# Patient Record
Sex: Female | Born: 1995 | Race: White | Hispanic: No | Marital: Single | Smoking: Never smoker
Health system: Southern US, Community
[De-identification: ages and names within clinical notes are randomized; demographics above are authoritative.]

## PROBLEM LIST (undated history)

## (undated) DIAGNOSIS — R51 Headache: Secondary | ICD-10-CM

## (undated) DIAGNOSIS — F419 Anxiety disorder, unspecified: Secondary | ICD-10-CM

## (undated) DIAGNOSIS — L309 Dermatitis, unspecified: Secondary | ICD-10-CM

## (undated) DIAGNOSIS — F329 Major depressive disorder, single episode, unspecified: Secondary | ICD-10-CM

## (undated) DIAGNOSIS — E669 Obesity, unspecified: Secondary | ICD-10-CM

## (undated) DIAGNOSIS — F32A Depression, unspecified: Secondary | ICD-10-CM

## (undated) DIAGNOSIS — J45909 Unspecified asthma, uncomplicated: Secondary | ICD-10-CM

## (undated) DIAGNOSIS — R519 Headache, unspecified: Secondary | ICD-10-CM

## (undated) HISTORY — DX: Depression, unspecified: F32.A

## (undated) HISTORY — DX: Headache, unspecified: R51.9

## (undated) HISTORY — DX: Major depressive disorder, single episode, unspecified: F32.9

## (undated) HISTORY — PX: WISDOM TOOTH EXTRACTION: SHX21

## (undated) HISTORY — DX: Anxiety disorder, unspecified: F41.9

## (undated) HISTORY — DX: Headache: R51

---
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
## 2018-12-02 ENCOUNTER — Ambulatory Visit (INDEPENDENT_AMBULATORY_CARE_PROVIDER_SITE_OTHER): Payer: BLUE CROSS/BLUE SHIELD | Admitting: Neurology

## 2018-12-02 ENCOUNTER — Telehealth: Payer: Self-pay | Admitting: Neurology

## 2018-12-02 ENCOUNTER — Encounter: Payer: Self-pay | Admitting: Neurology

## 2018-12-02 VITALS — BP 130/86 | HR 101 | Ht 65.0 in | Wt 257.0 lb

## 2018-12-02 DIAGNOSIS — R519 Headache, unspecified: Secondary | ICD-10-CM | POA: Insufficient documentation

## 2018-12-02 DIAGNOSIS — H539 Unspecified visual disturbance: Secondary | ICD-10-CM | POA: Diagnosis not present

## 2018-12-02 DIAGNOSIS — G8929 Other chronic pain: Secondary | ICD-10-CM

## 2018-12-02 DIAGNOSIS — R51 Headache with orthostatic component, not elsewhere classified: Secondary | ICD-10-CM

## 2018-12-02 DIAGNOSIS — G43709 Chronic migraine without aura, not intractable, without status migrainosus: Secondary | ICD-10-CM

## 2018-12-02 MED ORDER — METOCLOPRAMIDE HCL 10 MG PO TABS: 10.0000 mg | ORAL_TABLET | Freq: Three times a day (TID) | ORAL | 11 refills | Status: DC | PRN

## 2018-12-02 MED ORDER — RIZATRIPTAN BENZOATE 10 MG PO TBDP: 10.0000 mg | ORAL_TABLET | ORAL | 11 refills | Status: DC | PRN

## 2018-12-02 MED ORDER — VENLAFAXINE HCL ER 75 MG PO CP24: 75.0000 mg | ORAL_CAPSULE | Freq: Every day | ORAL | 11 refills | Status: DC

## 2018-12-02 NOTE — Progress Notes (Signed)
[facility]    Provider:  Dr Lucia Gaskins Referring Provider: Aida Puffer, MD Primary Care Physician:  Aida Puffer, MD  CC:  migraines  HPI:  Wendy Russo is a 22 y.o. female here as requested by Dr. Clarene Duke for  Migraines. PMHx asthma, depression and anxiety.She is here with her boyfriend who provides information. Migraines start on the left behind the eyes, pulsating/pounding, +photophobia, nausea, dizziness. Mother has migraines. Can last 24-72 hours or as little as 4 hours if treated Sumatriptan gave her side effects. Migraines started at the age of 27. Worsening over the last year. Boyfriend says she takes excedrin sometimes and it helps, 2-3x a week. She does not snore heavily, no other signs of sleep apnea. She can wake with the headache. She has associated blurry vision and worse positionally or with movement. She has lots of stress recently. She has 15 headache days a month. 5-8 migraine days that are severe or moderately severe.  She has never taken a preventative. She has depression and stress. No other focal neurologic deficits, associated symptoms, inciting events or modifiable factors.  Reviewed notes, labs and imaging from outside physicians, which showed:  Reviewed notes from Dr. Clarene Duke.  Patient is morbidly obese.  She also has asthma.  Examination was normal including neurologic exam.  Patient having more migraines coming more often weekly.  She takes Phenergan p.o. and Imitrex 100 mg.  Norco 4 times daily as needed pain.    Review of Systems: Patient complains of symptoms per HPI as well as the following symptoms: Headache, snoring, depression, anxiety. Pertinent negatives and positives per HPI. All others negative.   Social History   Socioeconomic History  . Marital status: Single    Spouse name: Not on file  . Number of children: Not on file  . Years of education: Not on file  . Highest education level: Not on file  Occupational History  . Not on  file  Social Needs  . Financial resource strain: Not on file  . Food insecurity:    Worry: Not on file    Inability: Not on file  . Transportation needs:    Medical: Not on file    Non-medical: Not on file  Tobacco Use  . Smoking status: Never Smoker  . Smokeless tobacco: Never Used  Substance and Sexual Activity  . Alcohol use: Yes    Comment: occasional  . Drug use: Never  . Sexual activity: Not on file  Lifestyle  . Physical activity:    Days per week: Not on file    Minutes per session: Not on file  . Stress: Not on file  Relationships  . Social connections:    Talks on phone: Not on file    Gets together: Not on file    Attends religious service: Not on file    Active member of club or organization: Not on file    Attends meetings of clubs or organizations: Not on file    Relationship status: Not on file  . Intimate partner violence:    Fear of current or ex partner: Not on file    Emotionally abused: Not on file    Physically abused: Not on file    Forced sexual activity: Not on file  Other Topics Concern  . Not on file  Social History Narrative  . Not on file    History reviewed. No pertinent family history.  Past Medical History:  Diagnosis Date  . Anxiety   . Depression   .  Headache     Past Surgical History:  Procedure Laterality Date  . WISDOM TOOTH EXTRACTION      Current Outpatient Medications  Medication Sig Dispense Refill  . cefdinir (OMNICEF) 300 MG capsule Take 300 mg by mouth 2 (two) times daily.    . metoCLOPramide (REGLAN) 10 MG tablet Take 1 tablet (10 mg total) by mouth every 8 (eight) hours as needed for nausea. For nausea, dizziness or migraines 30 tablet 11  . rizatriptan (MAXALT-MLT) 10 MG disintegrating tablet Take 1 tablet (10 mg total) by mouth as needed for migraine. May repeat in 2 hours if needed 9 tablet 11  . venlafaxine XR (EFFEXOR-XR) 75 MG 24 hr capsule Take 1 capsule (75 mg total) by mouth daily with breakfast. 30  capsule 11   No current facility-administered medications for this visit.     Allergies as of 12/02/2018  . (Not on File)    Vitals: BP 130/86   Pulse (!) 101   Ht 5\' 5"  (1.651 m)   Wt 257 lb (116.6 kg)   BMI 42.77 kg/m  Last Weight:  Wt Readings from Last 1 Encounters:  12/02/18 257 lb (116.6 kg)   Last Height:   Ht Readings from Last 1 Encounters:  12/02/18 5\' 5"  (1.651 m)   Physical exam: Exam: Gen: NAD, conversant, well nourised, obese, well groomed                     CV: RRR, no MRG. No Carotid Bruits. No peripheral edema, warm, nontender Eyes: Conjunctivae clear without exudates or hemorrhage  Neuro: Detailed Neurologic Exam  Speech:    Speech is normal; fluent and spontaneous with normal comprehension.  Cognition:    The patient is oriented to person, place, and time;     recent and remote memory intact;     language fluent;     normal attention, concentration,     fund of knowledge Cranial Nerves:    The pupils are equal, round, and reactive to light. The fundi are normal and spontaneous venous pulsations are present. Visual fields are full to finger confrontation. Extraocular movements are intact. Trigeminal sensation is intact and the muscles of mastication are normal. The face is symmetric. The palate elevates in the midline. Hearing intact. Voice is normal. Shoulder shrug is normal. The tongue has normal motion without fasciculations.   Coordination:    Normal finger to nose and heel to shin. Normal rapid alternating movements.   Gait:    Heel-toe and tandem gait are normal.   Motor Observation:    No asymmetry, no atrophy, and no involuntary movements noted. Tone:    Normal muscle tone.    Posture:    Posture is normal. normal erect    Strength:    Strength is V/V in the upper and lower limbs.      Sensation: intact to LT     Reflex Exam:  DTR's:    Deep tendon reflexes in the upper and lower extremities are normal bilaterally.    Toes:    The toes are downgoing bilaterally.   Clonus:    Clonus is absent.       Assessment/Plan:  22 year old with chronic headaches likely migraines but needs evaluation for other etiologies.  MRI brain due to concerning symptoms of morning headaches, positional headaches,vision changes  to look for space occupying mass, chiari or intracranial hypertension (pseudotumor).  Labs  Stop caffeine and any meds that can rebound.  Orders Placed This Encounter  Procedures  . MR BRAIN W WO CONTRAST  . Comprehensive metabolic panel  . CBC  . Thyroid Panel With TSH   Meds ordered this encounter  Medications  . venlafaxine XR (EFFEXOR-XR) 75 MG 24 hr capsule    Sig: Take 1 capsule (75 mg total) by mouth daily with breakfast.    Dispense:  30 capsule    Refill:  11  . rizatriptan (MAXALT-MLT) 10 MG disintegrating tablet    Sig: Take 1 tablet (10 mg total) by mouth as needed for migraine. May repeat in 2 hours if needed    Dispense:  9 tablet    Refill:  11  . metoCLOPramide (REGLAN) 10 MG tablet    Sig: Take 1 tablet (10 mg total) by mouth every 8 (eight) hours as needed for nausea. For nausea, dizziness or migraines    Dispense:  30 tablet    Refill:  11      Discussed: To prevent or relieve headaches, try the following: Cool Compress. Lie down and place a cool compress on your head.  Avoid headache triggers. If certain foods or odors seem to have triggered your migraines in the past, avoid them. A headache diary might help you identify triggers.  Include physical activity in your daily routine. Try a daily walk or other moderate aerobic exercise.  Manage stress. Find healthy ways to cope with the stressors, such as delegating tasks on your to-do list.  Practice relaxation techniques. Try deep breathing, yoga, massage and visualization.  Eat regularly. Eating regularly scheduled meals and maintaining a healthy diet might help prevent headaches. Also, drink plenty of fluids.   Follow a regular sleep schedule. Sleep deprivation might contribute to headaches Consider biofeedback. With this mind-body technique, you learn to control certain bodily functions - such as muscle tension, heart rate and blood pressure - to prevent headaches or reduce headache pain.    Proceed to emergency room if you experience new or worsening symptoms or symptoms do not resolve, if you have new neurologic symptoms or if headache is severe, or for any concerning symptom.   Provided education and documentation from [employer] toolbox including articles on: chronic migraine medication overuse headache, chronic migraines, prevention of migraines, behavioral and other nonpharmacologic treatments for headache.   Cc: Dr. Royston BakeLittle    Antonia Ahern, MD  [facility] [address] [place], [place] [id number]  Phone [phone] Fax [phone]

## 2018-12-02 NOTE — Telephone Encounter (Signed)
lvm for pt to call back about scheduling Wendy Russo E [employer] Auth: [id number] (exp. 12/02/18 to 12/31/18)

## 2018-12-02 NOTE — Patient Instructions (Signed)
Start Venlafaxine every day for prevention At onset of migraine Rizatriptan: Please take one tablet at the onset of your headache. If it does not improve the symptoms please take one additional tablet. Do not take more then 2 tablets in 24hrs. Do not take use more then 2 to 3 times in a week. For nausea or migraines: Metoclopramide as needed up to 3x a day can take it with the Rizatriptan.  Labs and MRI brain Metoclopramide tablets What is this medicine? METOCLOPRAMIDE (met oh kloe PRA mide) is used to treat the symptoms of gastroesophageal reflux disease (GERD) like heartburn. It is also used to treat people with slow emptying of the stomach and intestinal tract. This medicine may be used for other purposes; ask your health care provider or pharmacist if you have questions. COMMON BRAND NAME(S): Reglan What should I tell my health care provider before I take this medicine? They need to know if you have any of these conditions: -breast cancer -depression -diabetes -heart failure -high blood pressure -kidney disease -liver disease -Parkinson's disease or a movement disorder -pheochromocytoma -seizures -stomach obstruction, bleeding, or perforation -an unusual or allergic reaction to metoclopramide, procainamide, sulfites, other medicines, foods, dyes, or preservatives -pregnant or trying to get pregnant -breast-feeding How should I use this medicine? Take this medicine by mouth with a glass of water. Follow the directions on the prescription label. Take this medicine on an empty stomach, about 30 minutes before eating. Take your doses at regular intervals. Do not take your medicine more often than directed. Do not stop taking except on the advice of your doctor or health care professional. A special MedGuide will be given to you by the pharmacist with each prescription and refill. Be sure to read this information carefully each time. Talk to your pediatrician regarding the use of this  medicine in children. Special care may be needed. Overdosage: If you think you have taken too much of this medicine contact a poison control center or emergency room at once. NOTE: This medicine is only for you. Do not share this medicine with others. What if I miss a dose? If you miss a dose, take it as soon as you can. If it is almost time for your next dose, take only that dose. Do not take double or extra doses. What may interact with this medicine? -acetaminophen -cyclosporine -digoxin -medicines for blood pressure -medicines for diabetes, including insulin -medicines for hay fever and other allergies -medicines for depression, especially a Monoamine Oxidase Inhibitor (MAOI) -medicines for Parkinson's disease, like levodopa -medicines for sleep or for pain -quinidine -tetracycline This list may not describe all possible interactions. Give your health care provider a list of all the medicines, herbs, non-prescription drugs, or dietary supplements you use. Also tell them if you smoke, drink alcohol, or use illegal drugs. Some items may interact with your medicine. What should I watch for while using this medicine? It may take a few weeks for your stomach condition to start to get better. However, do not take this medicine for longer than 12 weeks. The longer you take this medicine, and the more you take it, the greater your chances are of developing serious side effects. If you are an elderly patient, a female patient, or you have diabetes, you may be at an increased risk for side effects from this medicine. Contact your doctor immediately if you start having movements you cannot control such as lip smacking, rapid movements of the tongue, involuntary or uncontrollable movements of the  eyes, head, arms and legs, or muscle twitches and spasms. Patients and their families should watch out for worsening depression or thoughts of suicide. Also watch out for any sudden or severe changes in feelings  such as feeling anxious, agitated, panicky, irritable, hostile, aggressive, impulsive, severely restless, overly excited and hyperactive, or not being able to sleep. If this happens, especially at the beginning of treatment or after a change in dose, call your doctor. Do not treat yourself for high fever. Ask your doctor or health care professional for advice. You may get drowsy or dizzy. Do not drive, use machinery, or do anything that needs mental alertness until you know how this drug affects you. Do not stand or sit up quickly, especially if you are an older patient. This reduces the risk of dizzy or fainting spells. Alcohol can make you more drowsy and dizzy. Avoid alcoholic drinks. What side effects may I notice from receiving this medicine? Side effects that you should report to your doctor or health care professional as soon as possible: -allergic reactions like skin rash, itching or hives, swelling of the face, lips, or tongue -abnormal production of milk in females -breast enlargement in both males and females -change in the way you walk -difficulty moving, speaking or swallowing -drooling, lip smacking, or rapid movements of the tongue -excessive sweating -fever -involuntary or uncontrollable movements of the eyes, head, arms and legs -irregular heartbeat or palpitations -muscle twitches and spasms -unusually weak or tired Side effects that usually do not require medical attention (report to your doctor or health care professional if they continue or are bothersome): -change in sex drive or performance -depressed mood -diarrhea -difficulty sleeping -headache -menstrual changes -restless or nervous This list may not describe all possible side effects. Call your doctor for medical advice about side effects. You may report side effects to FDA at 1-800-FDA-1088. Where should I keep my medicine? Keep out of the reach of children. Store at room temperature between 20 and 25 degrees C  (68 and 77 degrees F). Protect from light. Keep container tightly closed. Throw away any unused medicine after the expiration date. NOTE: This sheet is a summary. It may not cover all possible information. If you have questions about this medicine, talk to your doctor, pharmacist, or health care provider.  2018 [employer] Standard (2016-09-20 15:13:45)   Rizatriptan disintegrating tablets What is this medicine? RIZATRIPTAN (rye za TRIP tan) is used to treat migraines with or without aura. An aura is a strange feeling or visual disturbance that warns you of an attack. It is not used to prevent migraines. This medicine may be used for other purposes; ask your health care provider or pharmacist if you have questions. COMMON BRAND NAME(S): Maxalt-MLT What should I tell my health care provider before I take this medicine? They need to know if you have any of these conditions: -bowel disease or colitis -diabetes -family history of heart disease -fast or irregular heart beat -heart or blood vessel disease, angina (chest pain), or previous heart attack -high blood pressure -high cholesterol -history of stroke, transient ischemic attacks (TIAs or mini-strokes), or intracranial bleeding -kidney or liver disease -overweight -poor circulation -postmenopausal or surgical removal of uterus and ovaries -an unusual or allergic reaction to rizatriptan, other medicines, foods, dyes, or preservatives -pregnant or trying to get pregnant -breast-feeding How should I use this medicine? Take this medicine by mouth. Follow the directions on the prescription label. This medicine is taken at the first symptoms of a migraine.  It is not for everyday use. Leave the tablet in the foil package until you are ready to take it. Do not push the tablet through the blister pack. Peel open the blister pack with dry hands and place the tablet on your tongue. The tablet will dissolve rapidly and be swallowed in your saliva.  It is not necessary to drink any water to take this medicine. If your migraine headache returns after one dose, you can take another dose as directed. You must leave at least 2 hours between doses, and do not take more than 30 mg total in 24 hours. If there is no improvement at all after the first dose, do not take a second dose without talking to your doctor or health care professional. Do not take your medicine more often than directed. Talk to your pediatrician regarding the use of this medicine in children. While this drug may be prescribed for children as young as 6 years for selected conditions, precautions do apply. Overdosage: If you think you have taken too much of this medicine contact a poison control center or emergency room at once. NOTE: This medicine is only for you. Do not share this medicine with others. What if I miss a dose? This does not apply; this medicine is not for regular use. What may interact with this medicine? Do not take this medicine with any of the following medicines: -amphetamine, dextroamphetamine or cocaine -dihydroergotamine, ergotamine, ergoloid mesylates, methysergide, or ergot-type medication - do not take within 24 hours of taking rizatriptan -feverfew -MAOIs like Carbex, Eldepryl, Marplan, Nardil, and Parnate - do not take rizatriptan within 2 weeks of stopping MAOI therapy. -other migraine medicines like almotriptan, eletriptan, naratriptan, sumatriptan, zolmitriptan - do not take within 24 hours of taking rizatriptan -tryptophan This medicine may also interact with the following medications: -medicines for mental depression, anxiety or mood problems -propranolol This list may not describe all possible interactions. Give your health care provider a list of all the medicines, herbs, non-prescription drugs, or dietary supplements you use. Also tell them if you smoke, drink alcohol, or use illegal drugs. Some items may interact with your medicine. What should  I watch for while using this medicine? Only take this medicine for a migraine headache. Take it if you get warning symptoms or at the start of a migraine attack. It is not for regular use to prevent migraine attacks. You may get drowsy or dizzy. Do not drive, use machinery, or do anything that needs mental alertness until you know how this medicine affects you. To reduce dizzy or fainting spells, do not sit or stand up quickly, especially if you are an older patient. Alcohol can increase drowsiness, dizziness and flushing. Avoid alcoholic drinks. Smoking cigarettes may increase the risk of heart-related side effects from using this medicine. If you take migraine medicines for 10 or more days a month, your migraines may get worse. Keep a diary of headache days and medicine use. Contact your healthcare professional if your migraine attacks occur more frequently. What side effects may I notice from receiving this medicine? Side effects that you should report to your doctor or health care professional as soon as possible: -allergic reactions like skin rash, itching or hives, swelling of the face, lips, or tongue -fast, slow, or irregular heart beat -increased or decreased blood pressure -seizures -severe stomach pain and cramping, bloody diarrhea -signs and symptoms of a blood clot such as breathing problems; changes in vision; chest pain; severe, sudden headache; pain, swelling, warmth  in the leg; trouble speaking; sudden numbness or weakness of the face, arm or leg -tingling, pain, or numbness in the face, hands, or feet Side effects that usually do not require medical attention (report to your doctor or health care professional if they continue or are bothersome): -drowsiness -dry mouth -feeling warm, flushing, or redness of the face -headache -muscle cramps, pain -nausea, vomiting -unusually weak or tired This list may not describe all possible side effects. Call your doctor for medical advice  about side effects. You may report side effects to FDA at 1-800-FDA-1088. Where should I keep my medicine? Keep out of the reach of children. Store at room temperature between 15 and 30 degrees C (59 and 86 degrees F). Protect from light and moisture. Throw away any unused medicine after the expiration date. NOTE: This sheet is a summary. It may not cover all possible information. If you have questions about this medicine, talk to your doctor, pharmacist, or health care provider.  2018 [employer]/Gold Standard (2013-08-05 10:17:42)    Venlafaxine extended-release capsules What is this medicine? VENLAFAXINE(VEN la fax een) is used to treat depression, anxiety and panic disorder. This medicine may be used for other purposes; ask your health care provider or pharmacist if you have questions. COMMON BRAND NAME(S): Effexor XR What should I tell my health care provider before I take this medicine? They need to know if you have any of these conditions: -bleeding disorders -glaucoma -heart disease -high blood pressure -high cholesterol -kidney disease -liver disease -low levels of sodium in the blood -mania or bipolar disorder -seizures -suicidal thoughts, plans, or attempt; a previous suicide attempt by you or a family -take medicines that treat or prevent blood clots -thyroid disease -an unusual or allergic reaction to venlafaxine, desvenlafaxine, other medicines, foods, dyes, or preservatives -pregnant or trying to get pregnant -breast-feeding How should I use this medicine? Take this medicine by mouth with a full glass of water. Follow the directions on the prescription label. Do not cut, crush, or chew this medicine. Take it with food. If needed, the capsule may be carefully opened and the entire contents sprinkled on a spoonful of cool applesauce. Swallow the applesauce/pellet mixture right away without chewing and follow with a glass of water to ensure complete swallowing of the  pellets. Try to take your medicine at about the same time each day. Do not take your medicine more often than directed. Do not stop taking this medicine suddenly except upon the advice of your doctor. Stopping this medicine too quickly may cause serious side effects or your condition may worsen. A special MedGuide will be given to you by the pharmacist with each prescription and refill. Be sure to read this information carefully each time. Talk to your pediatrician regarding the use of this medicine in children. Special care may be needed. Overdosage: If you think you have taken too much of this medicine contact a poison control center or emergency room at once. NOTE: This medicine is only for you. Do not share this medicine with others. What if I miss a dose? If you miss a dose, take it as soon as you can. If it is almost time for your next dose, take only that dose. Do not take double or extra doses. What may interact with this medicine? Do not take this medicine with any of the following medications: -certain medicines for fungal infections like fluconazole, itraconazole, ketoconazole, posaconazole, voriconazole -cisapride -desvenlafaxine -dofetilide -dronedarone -duloxetine -levomilnacipran -linezolid -MAOIs like Carbex, Eldepryl, Marplan,  Nardil, and Parnate -methylene blue (injected into a vein) -milnacipran -pimozide -thioridazine -ziprasidone This medicine may also interact with the following medications: -amphetamines -aspirin and aspirin-like medicines -certain medicines for depression, anxiety, or psychotic disturbances -certain medicines for migraine headaches like almotriptan, eletriptan, frovatriptan, naratriptan, rizatriptan, sumatriptan, zolmitriptan -certain medicines for sleep -certain medicines that treat or prevent blood clots like dalteparin, enoxaparin,  warfarin -cimetidine -clozapine -diuretics -fentanyl -furazolidone -indinavir -isoniazid -lithium -metoprolol -NSAIDS, medicines for pain and inflammation, like ibuprofen or naproxen -other medicines that prolong the QT interval (cause an abnormal heart rhythm) -procarbazine -rasagiline -supplements like St. John's wort, kava kava, valerian -tramadol -tryptophan This list may not describe all possible interactions. Give your health care provider a list of all the medicines, herbs, non-prescription drugs, or dietary supplements you use. Also tell them if you smoke, drink alcohol, or use illegal drugs. Some items may interact with your medicine. What should I watch for while using this medicine? Tell your doctor if your symptoms do not get better or if they get worse. Visit your doctor or health care professional for regular checks on your progress. Because it may take several weeks to see the full effects of this medicine, it is important to continue your treatment as prescribed by your doctor. Patients and their families should watch out for new or worsening thoughts of suicide or depression. Also watch out for sudden changes in feelings such as feeling anxious, agitated, panicky, irritable, hostile, aggressive, impulsive, severely restless, overly excited and hyperactive, or not being able to sleep. If this happens, especially at the beginning of treatment or after a change in dose, call your health care professional. This medicine can cause an increase in blood pressure. Check with your doctor for instructions on monitoring your blood pressure while taking this medicine. You may get drowsy or dizzy. Do not drive, use machinery, or do anything that needs mental alertness until you know how this medicine affects you. Do not stand or sit up quickly, especially if you are an older patient. This reduces the risk of dizzy or fainting spells. Alcohol may interfere with the effect of this medicine.  Avoid alcoholic drinks. Your mouth may get dry. Chewing sugarless gum, sucking hard candy and drinking plenty of water will help. Contact your doctor if the problem does not go away or is severe. What side effects may I notice from receiving this medicine? Side effects that you should report to your doctor or health care professional as soon as possible: -allergic reactions like skin rash, itching or hives, swelling of the face, lips, or tongue -anxious -breathing problems -confusion -changes in vision -chest pain -confusion -elevated mood, decreased need for sleep, racing thoughts, impulsive behavior -eye pain -fast, irregular heartbeat -feeling faint or lightheaded, falls -feeling agitated, angry, or irritable -hallucination, loss of contact with reality -high blood pressure -loss of balance or coordination -palpitations -redness, blistering, peeling or loosening of the skin, including inside the mouth -restlessness, pacing, inability to keep still -seizures -stiff muscles -suicidal thoughts or other mood changes -trouble passing urine or change in the amount of urine -trouble sleeping -unusual bleeding or bruising -unusually weak or tired -vomiting Side effects that usually do not require medical attention (report to your doctor or health care professional if they continue or are bothersome): -change in sex drive or performance -change in appetite or weight -constipation -dizziness -dry mouth -headache -increased sweating -nausea -tired This list may not describe all possible side effects. Call your doctor for medical advice about side  effects. You may report side effects to FDA at 1-800-FDA-1088. Where should I keep my medicine? Keep out of the reach of children. Store at a controlled temperature between 20 and 25 degrees C (68 degrees and 77 degrees F), in a dry place. Throw away any unused medicine after the expiration date. NOTE: This sheet is a summary. It may not  cover all possible information. If you have questions about this medicine, talk to your doctor, pharmacist, or health care provider.  2018 [employer] Standard (2016-05-04 18:38:02)

## 2018-12-03 ENCOUNTER — Telehealth: Payer: Self-pay | Admitting: *Deleted

## 2018-12-03 LAB — COMPREHENSIVE METABOLIC PANEL
ALK PHOS: 93 IU/L (ref 39–117)
ALT: 25 IU/L (ref 0–32)
AST: 21 IU/L (ref 0–40)
Albumin/Globulin Ratio: 2 (ref 1.2–2.2)
Albumin: 4.5 g/dL (ref 3.5–5.5)
BUN/Creatinine Ratio: 17 (ref 9–23)
BUN: 14 mg/dL (ref 6–20)
Bilirubin Total: 0.3 mg/dL (ref 0.0–1.2)
CALCIUM: 9.3 mg/dL (ref 8.7–10.2)
CO2: 19 mmol/L — AB (ref 20–29)
CREATININE: 0.81 mg/dL (ref 0.57–1.00)
Chloride: 103 mmol/L (ref 96–106)
GFR calc Af Amer: 118 mL/min/{1.73_m2} (ref >59)
GFR calc non Af Amer: 102 mL/min/{1.73_m2} (ref >59)
GLUCOSE: 76 mg/dL (ref 65–99)
Globulin, Total: 2.2 g/dL (ref 1.5–4.5)
Potassium: 4.1 mmol/L (ref 3.5–5.2)
Sodium: 142 mmol/L (ref 134–144)
Total Protein: 6.7 g/dL (ref 6.0–8.5)

## 2018-12-03 LAB — CBC
HEMATOCRIT: 41.6 % (ref 34.0–46.6)
HEMOGLOBIN: 13.3 g/dL (ref 11.1–15.9)
MCH: 27.2 pg (ref 26.6–33.0)
MCHC: 32 g/dL (ref 31.5–35.7)
MCV: 85 fL (ref 79–97)
PLATELETS: 288 10*3/uL (ref 150–450)
RBC: 4.89 x10E6/uL (ref 3.77–5.28)
RDW: 12.6 % (ref 12.3–15.4)
WBC: 7.9 10*3/uL (ref 3.4–10.8)

## 2018-12-03 LAB — THYROID PANEL WITH TSH
FREE THYROXINE INDEX: 1.8 (ref 1.2–4.9)
T3 UPTAKE RATIO: 26 % (ref 24–39)
T4, Total: 6.8 ug/dL (ref 4.5–12.0)
TSH: 2.55 u[IU]/mL (ref 0.450–4.500)

## 2018-12-03 NOTE — Telephone Encounter (Signed)
-----   Message from Anson FretAntonia B Ahern, MD sent at 12/03/2018 10:00 AM EST ----- Labs look fine thanks

## 2018-12-03 NOTE — Telephone Encounter (Signed)
Called pt & LVM (ok per DPR) informing pt that her labs look fine. Left office number for call back if she has any questions. Informed pt that return call is not required.

## 2019-04-30 ENCOUNTER — Emergency Department (HOSPITAL_BASED_OUTPATIENT_CLINIC_OR_DEPARTMENT_OTHER): Payer: BLUE CROSS/BLUE SHIELD

## 2019-04-30 ENCOUNTER — Emergency Department (HOSPITAL_BASED_OUTPATIENT_CLINIC_OR_DEPARTMENT_OTHER)
Admission: EM | Admit: 2019-04-30 | Discharge: 2019-04-30 | Disposition: A | Discharge status: Home / Self Care | Payer: BLUE CROSS/BLUE SHIELD | Attending: Emergency Medicine | Admitting: Emergency Medicine

## 2019-04-30 ENCOUNTER — Other Ambulatory Visit: Payer: Self-pay

## 2019-04-30 ENCOUNTER — Encounter (HOSPITAL_BASED_OUTPATIENT_CLINIC_OR_DEPARTMENT_OTHER): Payer: Self-pay | Admitting: Emergency Medicine

## 2019-04-30 DIAGNOSIS — J45909 Unspecified asthma, uncomplicated: Secondary | ICD-10-CM | POA: Insufficient documentation

## 2019-04-30 DIAGNOSIS — K5732 Diverticulitis of large intestine without perforation or abscess without bleeding: Secondary | ICD-10-CM | POA: Insufficient documentation

## 2019-04-30 DIAGNOSIS — R1032 Left lower quadrant pain: Secondary | ICD-10-CM | POA: Diagnosis present

## 2019-04-30 HISTORY — DX: Unspecified asthma, uncomplicated: J45.909

## 2019-04-30 HISTORY — DX: Obesity, unspecified: E66.9

## 2019-04-30 HISTORY — DX: Dermatitis, unspecified: L30.9

## 2019-04-30 LAB — COMPREHENSIVE METABOLIC PANEL
ALT: 22 U/L (ref 0–44)
AST: 22 U/L (ref 15–41)
Albumin: 3.9 g/dL (ref 3.5–5.0)
Alkaline Phosphatase: 74 U/L (ref 38–126)
Anion gap: 7 (ref 5–15)
BUN: 11 mg/dL (ref 6–20)
CO2: 22 mmol/L (ref 22–32)
Calcium: 8.6 mg/dL — ABNORMAL LOW (ref 8.9–10.3)
Chloride: 108 mmol/L (ref 98–111)
Creatinine, Ser: 0.64 mg/dL (ref 0.44–1.00)
GFR calc Af Amer: >60 mL/min (ref >60)
GFR calc non Af Amer: >60 mL/min (ref >60)
Glucose, Bld: 90 mg/dL (ref 70–99)
Potassium: 3.9 mmol/L (ref 3.5–5.1)
Sodium: 137 mmol/L (ref 135–145)
Total Bilirubin: 0.8 mg/dL (ref 0.3–1.2)
Total Protein: 6.7 g/dL (ref 6.5–8.1)

## 2019-04-30 LAB — CBC WITH DIFFERENTIAL/PLATELET
Abs Immature Granulocytes: 0.03 10*3/uL (ref 0.00–0.07)
Basophils Absolute: 0.1 10*3/uL (ref 0.0–0.1)
Basophils Relative: 1 %
Eosinophils Absolute: 0.6 10*3/uL — ABNORMAL HIGH (ref 0.0–0.5)
Eosinophils Relative: 7 %
HCT: 41.4 % (ref 36.0–46.0)
Hemoglobin: 12.6 g/dL (ref 12.0–15.0)
Immature Granulocytes: 0 %
Lymphocytes Relative: 18 %
Lymphs Abs: 1.7 10*3/uL (ref 0.7–4.0)
MCH: 26.5 pg (ref 26.0–34.0)
MCHC: 30.4 g/dL (ref 30.0–36.0)
MCV: 87.2 fL (ref 80.0–100.0)
Monocytes Absolute: 0.8 10*3/uL (ref 0.1–1.0)
Monocytes Relative: 8 %
Neutro Abs: 6.3 10*3/uL (ref 1.7–7.7)
Neutrophils Relative %: 66 %
Platelets: 264 10*3/uL (ref 150–400)
RBC: 4.75 MIL/uL (ref 3.87–5.11)
RDW: 13 % (ref 11.5–15.5)
WBC: 9.4 10*3/uL (ref 4.0–10.5)
nRBC: 0 % (ref 0.0–0.2)

## 2019-04-30 LAB — PREGNANCY, URINE: Preg Test, Ur: NEGATIVE

## 2019-04-30 MED ORDER — AMOXICILLIN-POT CLAVULANATE 875-125 MG PO TABS: 1.0000 | ORAL_TABLET | Freq: Two times a day (BID) | ORAL | 0 refills | Status: AC

## 2019-04-30 MED ORDER — IOHEXOL 300 MG/ML  SOLN
100.0000 mL | Freq: Once | INTRAMUSCULAR | Status: AC
  Administered 2019-04-30: 13:00:00 100 mL via INTRAVENOUS

## 2019-04-30 MED FILL — AMOX-CLAV 875-125 MG TABLET: 875-125 | 7 days supply | Qty: 14 | Fill #0

## 2019-04-30 NOTE — ED Provider Notes (Signed)
Emergency Department Provider Note   I have reviewed the triage vital signs and the nursing notes.   HISTORY  Chief Complaint Abdominal Pain   HPI Wendy Russo is a 23 y.o. female with PMH of asthma and elevated BMI since to the emergency department for evaluation of left lower quadrant abdominal pain.  Symptoms began yesterday.  She describes intermittent, spasm type pain.  She denies any vaginal bleeding or discharge.  No diarrhea, vomiting, fever.  Pain radiates somewhat to the flank.  Denies any dysuria, hesitancy, urgency, gross hematuria.  Patient initially was seen at urgent care and referred here for evaluation of possible diverticulitis versus ovarian cyst/torsion.  Patient denies any modifying factors.  Pain at this time is mild to moderate and she does not wish to have any pain medication. UA and Urine pregnancy from UC resulted and negative. Patient has paperwork at bedside.   Past Medical History:  Diagnosis Date  . Anxiety   . Asthma   . Depression   . Eczema   . Headache   . Obesity     Patient Active Problem List   Diagnosis Date Noted  . Chronic intractable headache 12/02/2018    Past Surgical History:  Procedure Laterality Date  . WISDOM TOOTH EXTRACTION     believes 3 were removed    Allergies Patient has no known allergies.  Family History  Problem Relation Age of Onset  . Migraines Mother     Social History Social History   Tobacco Use  . Smoking status: Never Smoker  . Smokeless tobacco: Never Used  Substance Use Topics  . Alcohol use: Yes    Comment: occasional  . Drug use: Never    Review of Systems  Constitutional: No fever/chills Eyes: No visual changes. ENT: No sore throat. Cardiovascular: Denies chest pain. Respiratory: Denies shortness of breath. Gastrointestinal: Positive LLQ abdominal pain.  No nausea, no vomiting.  No diarrhea.  No constipation. Genitourinary: Negative for dysuria. Musculoskeletal: Negative for back  pain. Skin: Negative for rash. Neurological: Negative for headaches, focal weakness or numbness.  10-point ROS otherwise negative.  ____________________________________________   PHYSICAL EXAM:  VITAL SIGNS: ED Triage Vitals  Enc Vitals Group     BP 04/30/19 1003 (!) 142/84     Pulse Rate 04/30/19 1003 85     Resp 04/30/19 1003 16     Temp 04/30/19 1003 98.7 F (37.1 C)     Temp Source 04/30/19 1003 Oral     SpO2 04/30/19 1003 100 %     Weight 04/30/19 1003 220 lb (99.8 kg)     Height 04/30/19 1003 5\' 5"  (1.651 m)     Pain Score 04/30/19 1004 7   Constitutional: Alert and oriented. Well appearing and in no acute distress. Eyes: Conjunctivae are normal.  Head: Atraumatic. Nose: No congestion/rhinnorhea. Mouth/Throat: Mucous membranes are moist.  Neck: No stridor.   Cardiovascular: Normal rate, regular rhythm. Good peripheral circulation. Grossly normal heart sounds.   Respiratory: Normal respiratory effort.  No retractions. Lungs CTAB. Gastrointestinal: Soft with focal LLQ tenderness which is mild. No guarding. No rebound. No distention.  Musculoskeletal: No gross deformities of extremities. Neurologic:  Normal speech and language. Skin:  Skin is warm, dry and intact. No rash noted.  ____________________________________________   LABS (all labs ordered are listed, but only abnormal results are displayed)  Labs Reviewed  CBC WITH DIFFERENTIAL/PLATELET - Abnormal; Notable for the following components:      Result Value   Eosinophils  Absolute 0.6 (*)    All other components within normal limits  COMPREHENSIVE METABOLIC PANEL - Abnormal; Notable for the following components:   Calcium 8.6 (*)    All other components within normal limits  PREGNANCY, URINE   ____________________________________________  RADIOLOGY  US Transvaginal Non-ob  Result Date: 04/30/2019 CLINICAL DATA:  Left lower quadrant pain. EXAM: TRANSABDOMINAL AND TRANSVAGINAL ULTRASOUND OF PELVIS  DOPPLER ULTRASOUND OF OVARIES TECHNIQUE: Both transabdominal and transvaginal ultrasound examinations of the pelvis were performed. Transabdominal technique was performed for global imaging of the pelvis including uterus, ovaries, adnexal regions, and pelvic cul-de-sac. It was necessary to proceed with endovaginal exam following the transabdominal exam to visualize the ovaries and adnexa. Color and duplex Doppler ultrasound was utilized to evaluate blood flow to the ovaries. COMPARISON:  None. FINDINGS: Uterus Measurements: 7.4 x 3.9 x 5.6 cm = volume: 85 mL. 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 No fibroids or other mass visualized. Endometrium Thickness: 8 mm.  No focal abnormality visualized. Right ovary Measurements: 4.2 x 2.3 x 4.0 cm = volume: 21 mL. Normal appearance/no adnexal mass. Corpus luteum. Left ovary Measurements: 2.8 x 1.9 x 2.9 cm = volume: 8 mL. Normal appearance/no adnexal mass. Pulsed Doppler evaluation of both ovaries demonstrates normal low-resistance arterial and venous waveforms. Other findings Small free fluid in the pelvis, likely physiologic. IMPRESSION: 1. No acute abnormality. 2. 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 Electronically Signed   By: Obie Dredge M.D.   On: 04/30/2019 12:01   US Pelvis Complete  Result Date: 04/30/2019 CLINICAL DATA:  Left lower quadrant pain. EXAM: TRANSABDOMINAL AND TRANSVAGINAL ULTRASOUND OF PELVIS DOPPLER ULTRASOUND OF OVARIES TECHNIQUE: Both transabdominal and transvaginal ultrasound examinations of the pelvis were performed. Transabdominal technique was performed for global imaging of the pelvis including uterus, ovaries, adnexal regions, and pelvic cul-de-sac. It was necessary to proceed with endovaginal exam following the transabdominal exam to visualize the ovaries and adnexa. Color and duplex Doppler ultrasound was utilized to evaluate blood flow to the ovaries. COMPARISON:  None. FINDINGS: Uterus Measurements: 7.4 x 3.9 x  5.6 cm = volume: 85 mL. 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 No fibroids or other mass visualized. Endometrium Thickness: 8 mm.  No focal abnormality visualized. Right ovary Measurements: 4.2 x 2.3 x 4.0 cm = volume: 21 mL. Normal appearance/no adnexal mass. Corpus luteum. Left ovary Measurements: 2.8 x 1.9 x 2.9 cm = volume: 8 mL. Normal appearance/no adnexal mass. Pulsed Doppler evaluation of both ovaries demonstrates normal low-resistance arterial and venous waveforms. Other findings Small free fluid in the pelvis, likely physiologic. IMPRESSION: 1. No acute abnormality. 2. 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 Electronically Signed   By: Obie Dredge M.D.   On: 04/30/2019 12:01   Ct Abdomen Pelvis W Contrast  Result Date: 04/30/2019 CLINICAL DATA:  23 year old female with left lower quadrant abdominal pain EXAM: CT ABDOMEN AND PELVIS WITH CONTRAST TECHNIQUE: Multidetector CT imaging of the abdomen and pelvis was performed using the standard protocol following bolus administration of intravenous contrast. CONTRAST:  OMNIPAQUE IOHEXOL 300 MG/ML  SOLN COMPARISON:  None. FINDINGS: Lower chest: No acute finding of the lower chest Hepatobiliary: Cranial caudal span of the right liver measures 18 cm. Unremarkable gallbladder Pancreas: Unremarkable Spleen: Unremarkable Adrenals/Urinary Tract: Unremarkable appearance of the adrenal glands. No evidence of hydronephrosis of the right or left kidney. No nephrolithiasis. Unremarkable course of the bilateral ureters. Unremarkable appearance of the urinary bladder. Stomach/Bowel: Unremarkable stomach. Unremarkable small bowel. Normal appendix. Mild to moderate stool burden.  Diverticula are present within descending colon and sigmoid colon. Focal inflammatory changes of the descending colon with short segment of circumferential wall thickening, with local edema/inflammation of the fat. No extraluminal air or fluid collection. Vascular/Lymphatic:  No atherosclerotic changes with unremarkable appearance of the vasculature. Unremarkable venous structures. Small lymph nodes of the ileocolic nodal station. Small lymph nodes of the mesentery. No retroperitoneal or inguinal adenopathy. Reproductive: Trace fluid within the endometrial canal. Physiologic changes of the adnexa. Trace free fluid within the pelvis. Other: None Musculoskeletal: No acute displaced fracture. IMPRESSION: Acute diverticulitis descending colon, uncomplicated. Physiologic changes of the adnexa with physiologic fluid/reactive fluid of the anatomic pelvis. Hepatomegaly. Electronically Signed   By: Gilmer Mor D.O.   On: 04/30/2019 12:49   [place] Art/ven Flow Abd Pelv Doppler  Result Date: 04/30/2019 CLINICAL DATA:  Left lower quadrant pain. EXAM: TRANSABDOMINAL AND TRANSVAGINAL ULTRASOUND OF PELVIS DOPPLER ULTRASOUND OF OVARIES TECHNIQUE: Both transabdominal and transvaginal ultrasound examinations of the pelvis were performed. Transabdominal technique was performed for global imaging of the pelvis including uterus, ovaries, adnexal regions, and pelvic cul-de-sac. It was necessary to proceed with endovaginal exam following the transabdominal exam to visualize the ovaries and adnexa. Color and duplex Doppler ultrasound was utilized to evaluate blood flow to the ovaries. COMPARISON:  None. FINDINGS: Uterus Measurements: 7.4 x 3.9 x 5.6 cm = volume: 85 mL. 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 No fibroids or other mass visualized. Endometrium Thickness: 8 mm.  No focal abnormality visualized. Right ovary Measurements: 4.2 x 2.3 x 4.0 cm = volume: 21 mL. Normal appearance/no adnexal mass. Corpus luteum. Left ovary Measurements: 2.8 x 1.9 x 2.9 cm = volume: 8 mL. Normal appearance/no adnexal mass. Pulsed Doppler evaluation of both ovaries demonstrates normal low-resistance arterial and venous waveforms. Other findings Small free fluid in the pelvis, likely physiologic. IMPRESSION: 1. No acute  abnormality. 2. 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 Electronically Signed   By: Obie Dredge M.D.   On: 04/30/2019 12:01    ____________________________________________   PROCEDURES  Procedure(s) performed:   Procedures  None  ____________________________________________   INITIAL IMPRESSION / ASSESSMENT AND PLAN / ED COURSE  Pertinent labs & imaging results that were available during my care of the patient were reviewed by me and considered in my medical decision making (see chart for details).   Patient presents to the emergency department with left lower quadrant abdominal pain.  She has mild tenderness on exam but no peritonitis.  No diarrhea symptoms.  Suspicion for diverticulitis is lower although she is having pain in the correct location.  Plan to start with transvaginal ultrasound to rule out torsion as this is the more emergent diagnosis and can follow with CT afterwards if unremarkable.  UA from urgent care reviewed with no evidence of UTI.   [place] negative for ovarian pathology. CT shows diverticulitis without complications. Plan for Augmentin and PCP/GI follow up within the next week by phone. GI appointment in the next month. Patient's pain is well controlled. No nausea/vomiting. Patient a good candidate for outpatient treatment. Patient comfortable with the plan at discharge. Discussed ED return precautions.  ____________________________________________  FINAL CLINICAL IMPRESSION(S) / ED DIAGNOSES  Final diagnoses:  Diverticulitis of large intestine without perforation or abscess without bleeding     MEDICATIONS GIVEN DURING THIS VISIT:  Medications  iohexol (OMNIPAQUE) 300 MG/ML solution 100 mL (100 mLs Intravenous Contrast Given 04/30/19 1230)     NEW OUTPATIENT MEDICATIONS STARTED DURING THIS VISIT:  Discharge Medication List as  of 04/30/2019  1:05 PM    START taking these medications   Details  amoxicillin-clavulanate (AUGMENTIN) 875-125 MG  tablet Take 1 tablet by mouth 2 (two) times daily for 7 days., Starting Wed 04/30/2019, Until Wed 05/07/2019, Normal        Note:  This document was prepared using Dragon voice recognition software and may include unintentional dictation errors.  Alona BeneJoshua Long, MD Emergency Medicine    Long, Arlyss RepressJoshua G, MD 04/30/19 [phone]

## 2019-04-30 NOTE — ED Triage Notes (Signed)
LLQ abd pain since yesterday.  Constant.  No hx. Was seen at [facility] and sent here for eval for Diverticulitis.  Denies n/v/d.

## 2019-04-30 NOTE — ED Notes (Signed)
Patient transported to CT 

## 2019-04-30 NOTE — Discharge Instructions (Signed)
We believe your symptoms are caused by diverticulitis.  Most of the time this condition (please read through the included information) can be cured with outpatient antibiotics.  Please take the full course of prescribed medication(s) and follow up with the doctors recommended above.  Return to the ED if your abdominal pain worsens or fails to improve, you develop bloody vomiting, bloody diarrhea, you are unable to tolerate fluids due to vomiting, fever greater than 101, or other symptoms that concern you.  You will need to call your PCP and schedule a follow up appointment with a gastroenterologist in the next 2-3 weeks.

## 2019-04-30 NOTE — ED Notes (Signed)
Patient transported to Ultrasound 

## 2020-11-20 IMAGING — CT CT ABDOMEN AND PELVIS WITH CONTRAST
2 of 4 series · 16 of 46 positions shown, 18 images · IV contrast (APPLIED)
Comparison: None.

CLINICAL DATA: 22-year-old female with left lower quadrant
abdominal pain

EXAM:
CT ABDOMEN AND PELVIS WITH CONTRAST
TECHNIQUE: Multidetector CT imaging of the abdomen and pelvis was performed
using the standard protocol following bolus administration of
intravenous contrast.
CONTRAST:  100mL OMNIPAQUE IOHEXOL 300 MG/ML  SOLN

[Series 2: axial st · axial · 0.70mm/px · z∈[+776,+1181]mm · 13 of 89 slices shown, 15 images]
[im 4/89  soft-tissue]
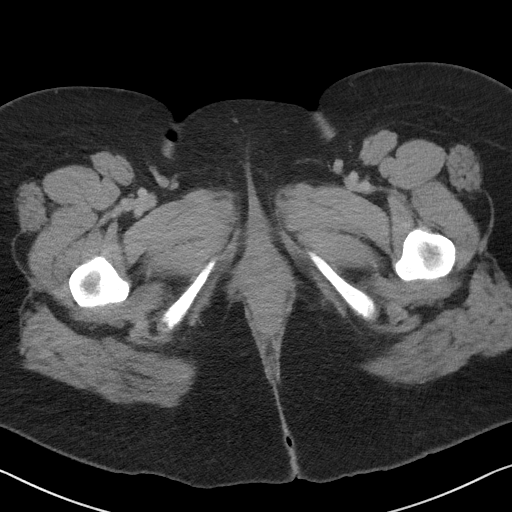
[im 4/89  bone]
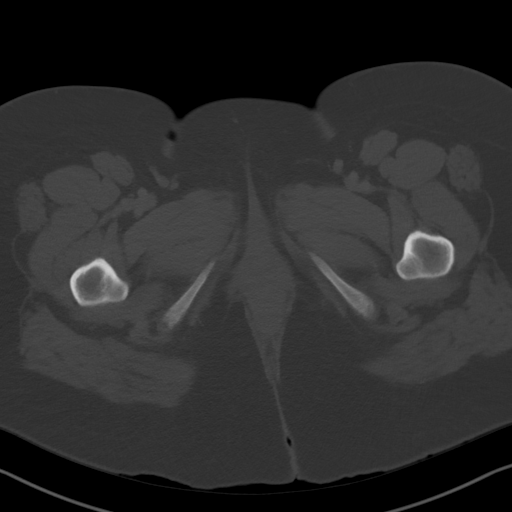
[im 12/89  soft-tissue]
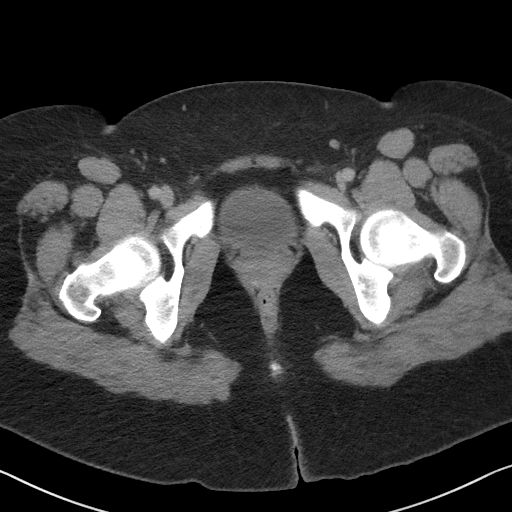
[im 19/89  soft-tissue]
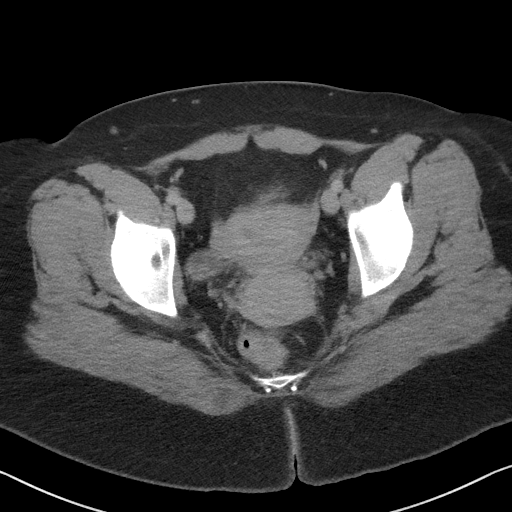
[im 26/89  soft-tissue]
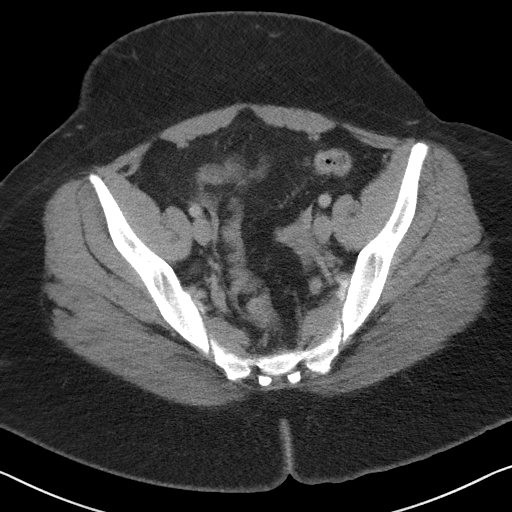
[im 30/89  soft-tissue]
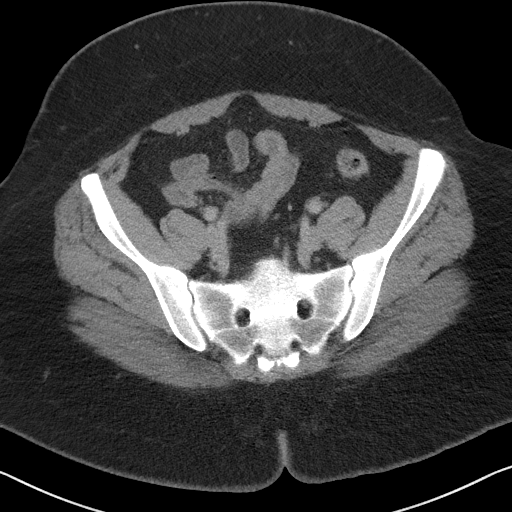
[im 37/89  soft-tissue]
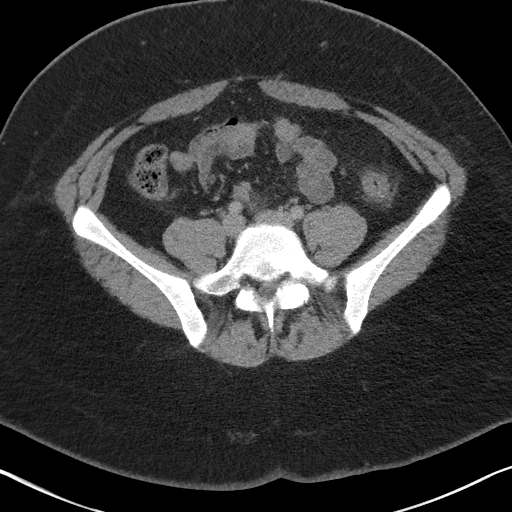
[im 45/89  soft-tissue]
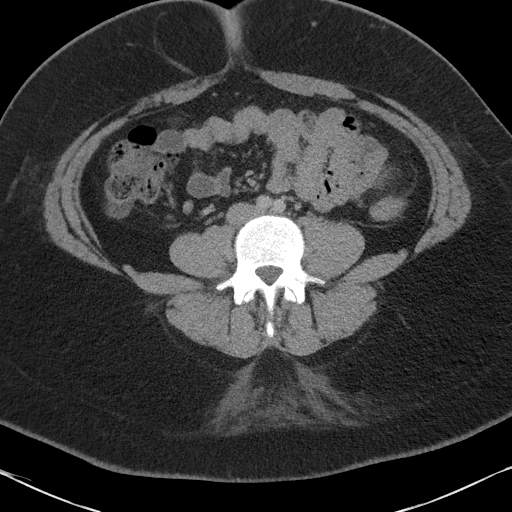
[im 52/89  soft-tissue]
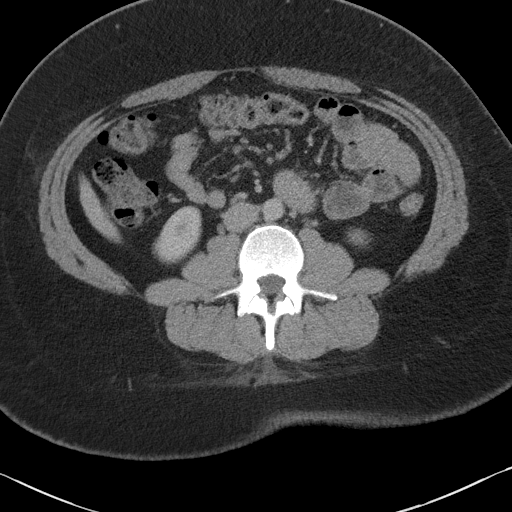
[im 59/89  soft-tissue]
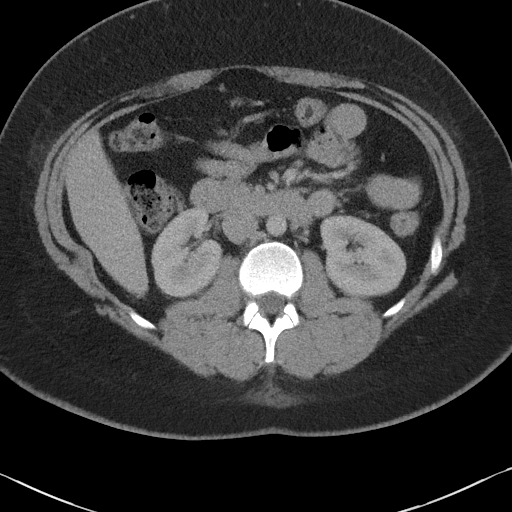
[im 59/89  bone]
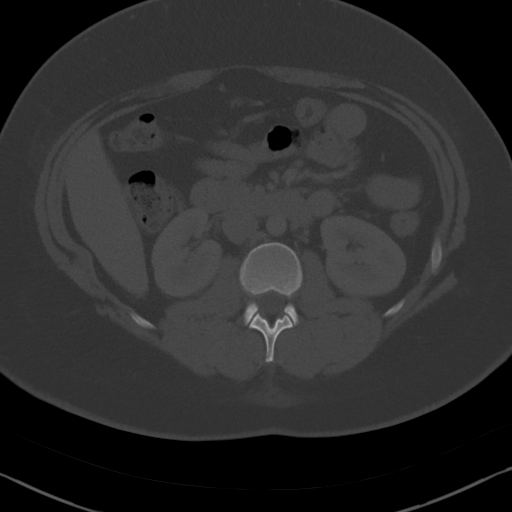
[im 63/89  soft-tissue]
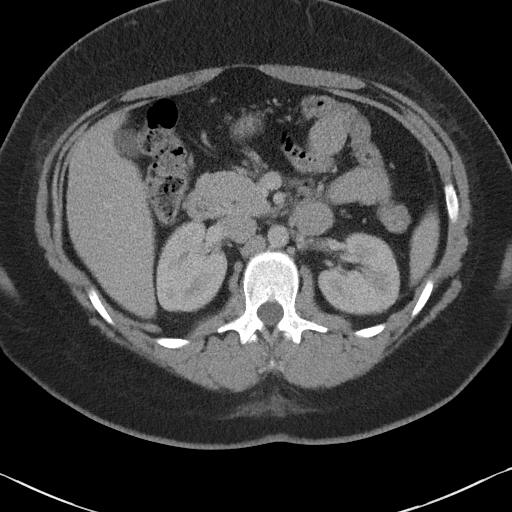
[im 70/89  soft-tissue]
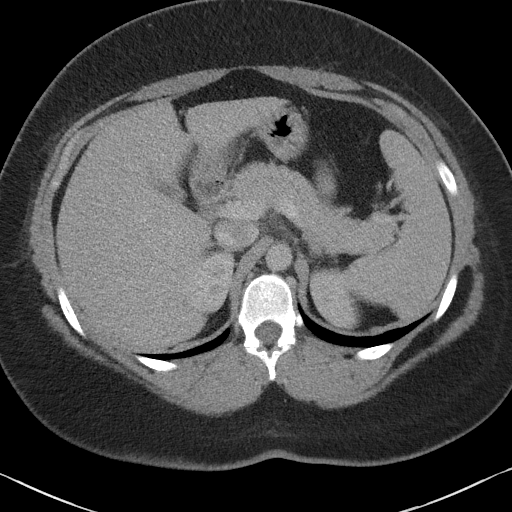
[im 78/89  soft-tissue]
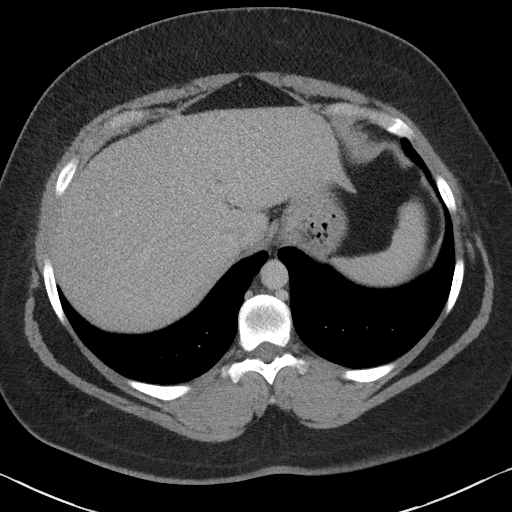
[im 85/89  soft-tissue]
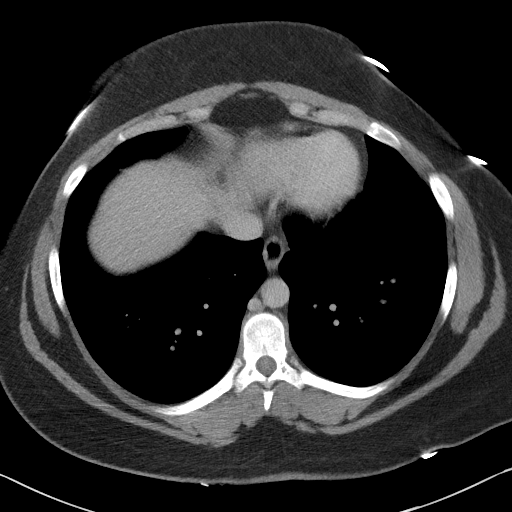

[Series 5: coronal st · coronal · 0.70mm/px · 3 of 102 slices shown]
[im 34/102  soft-tissue]
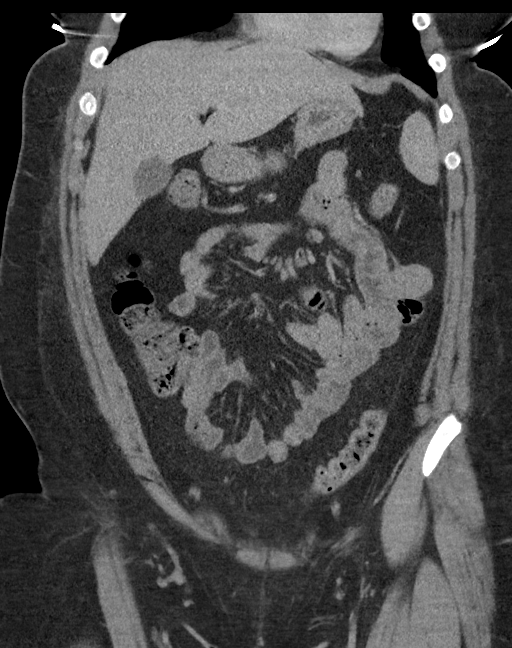
[im 45/102  soft-tissue]
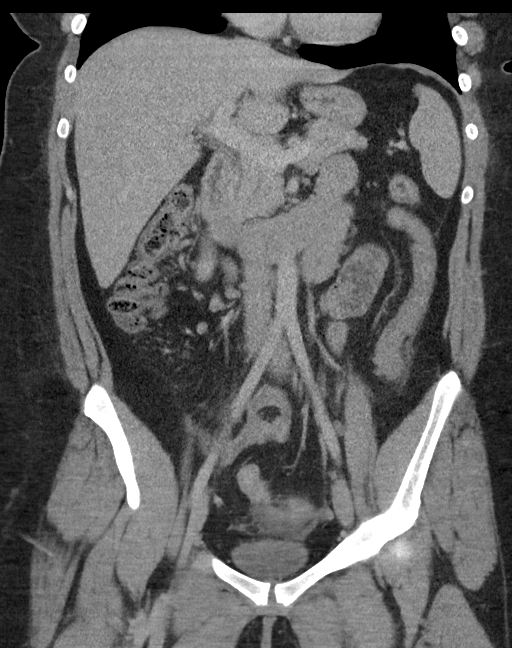
[im 57/102  soft-tissue]
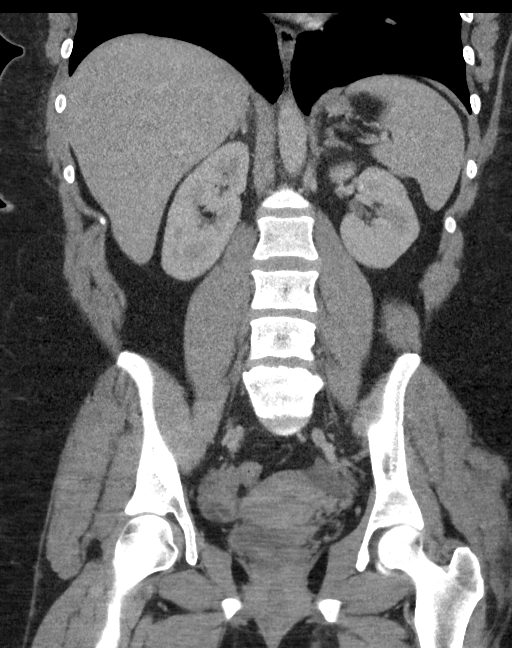

[16 of 46 positions shown; findings below may reference images not displayed]

FINDINGS: Lower chest: No acute finding of the lower chest

Hepatobiliary: Cranial caudal span of the right liver measures 18
cm. Unremarkable gallbladder

Pancreas: Unremarkable

Spleen: Unremarkable

Adrenals/Urinary Tract: Unremarkable appearance of the adrenal
glands. No evidence of hydronephrosis of the right or left kidney.
No nephrolithiasis. Unremarkable course of the bilateral ureters.
Unremarkable appearance of the urinary bladder.

Stomach/Bowel: Unremarkable stomach. Unremarkable small bowel.
Normal appendix. Mild to moderate stool burden. Diverticula are
present within descending colon and sigmoid colon. Focal
inflammatory changes of the descending colon with short segment of
circumferential wall thickening, with local edema/inflammation of
the fat. No extraluminal air or fluid collection.

Vascular/Lymphatic: No atherosclerotic changes with unremarkable
appearance of the vasculature. Unremarkable venous structures.

Small lymph nodes of the ileocolic nodal station. Small lymph nodes
of the mesentery. No retroperitoneal or inguinal adenopathy.

Reproductive: Trace fluid within the endometrial canal. Physiologic
changes of the adnexa. Trace free fluid within the pelvis.

Other: None

Musculoskeletal: No acute displaced fracture.
IMPRESSION: Acute diverticulitis descending colon, uncomplicated.

Physiologic changes of the adnexa with physiologic fluid/reactive
fluid of the anatomic pelvis.

Hepatomegaly.

## 2020-11-20 IMAGING — US US PELVIS COMPLETE
1 series · 13 of 25 positions shown · non-contrast
Comparison: None.

CLINICAL DATA: Left lower quadrant pain.

EXAM:
TRANSABDOMINAL AND TRANSVAGINAL ULTRASOUND OF PELVIS
DOPPLER ULTRASOUND OF OVARIES
TECHNIQUE: Both transabdominal and transvaginal ultrasound examinations of the
pelvis were performed. Transabdominal technique was performed for
global imaging of the pelvis including uterus, ovaries, adnexal
regions, and pelvic cul-de-sac.
It was necessary to proceed with endovaginal exam following the
transabdominal exam to visualize the ovaries and adnexa. Color and
duplex Doppler ultrasound was utilized to evaluate blood flow to the
ovaries.

[Series 1: us pelvis complete · 13 of 101 slices shown]
[im 1/101]
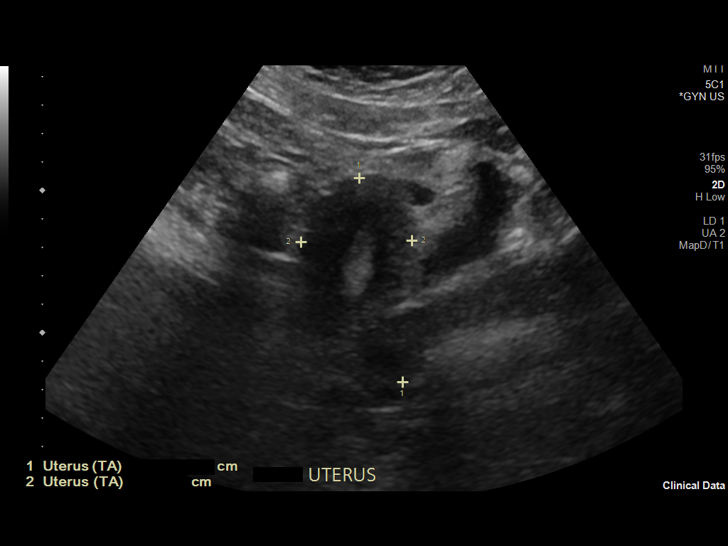
[im 9/101]
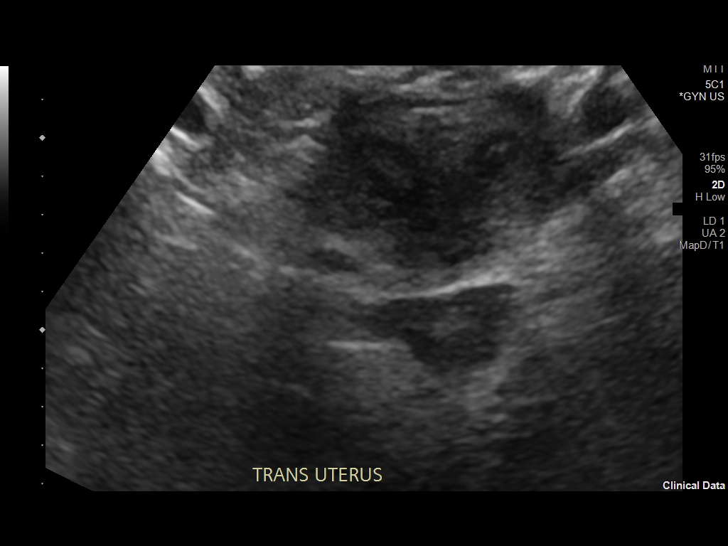
[im 17/101]
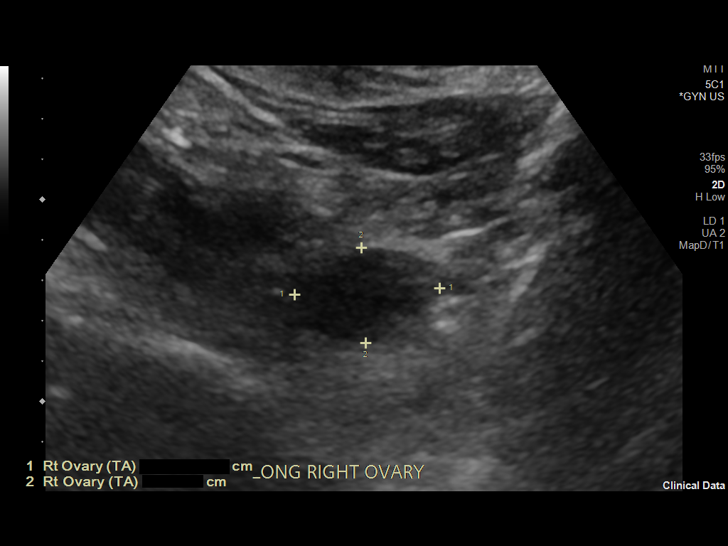
[im 26/101]
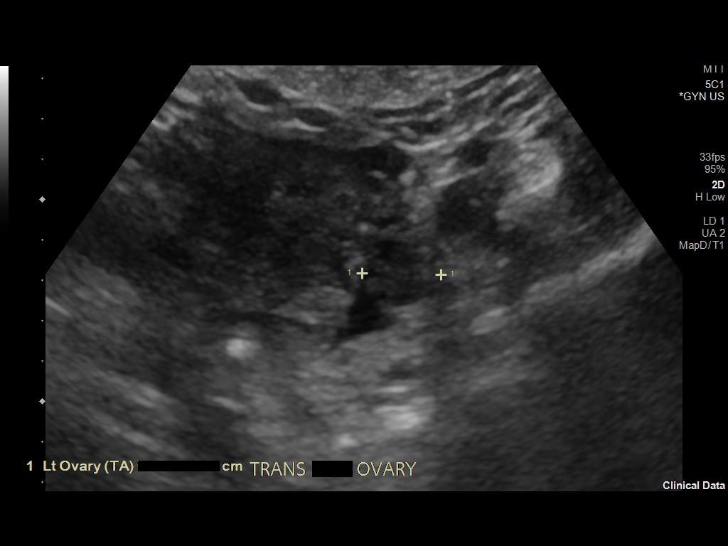
[im 34/101]
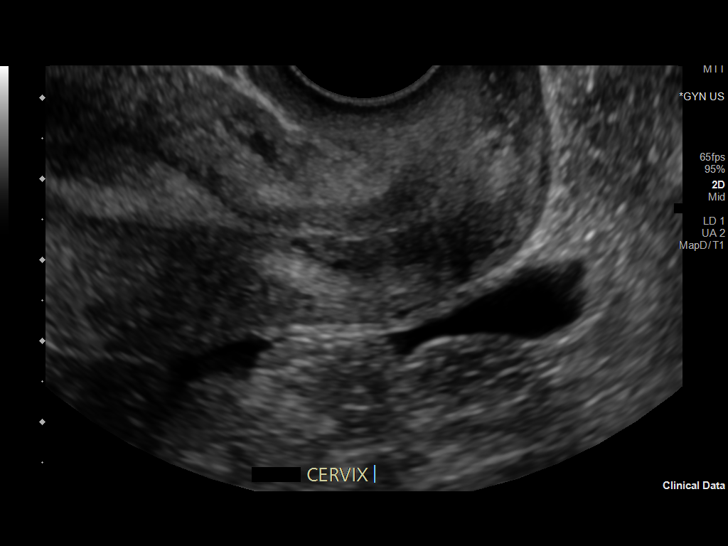
[im 42/101]
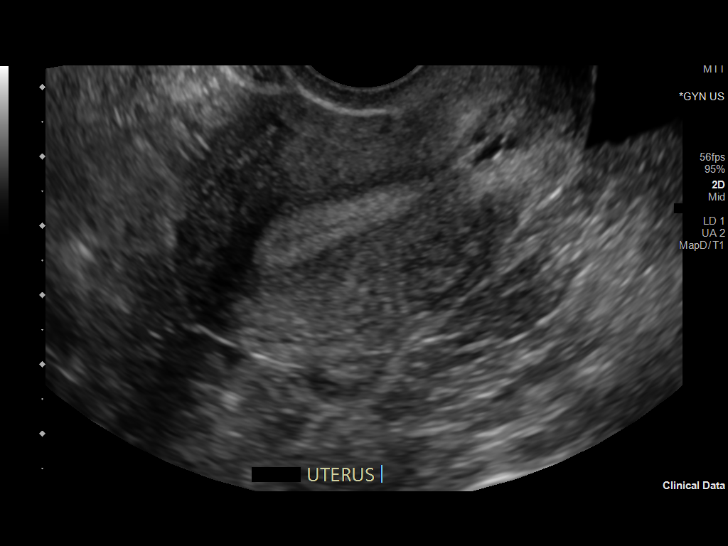
[im 51/101]
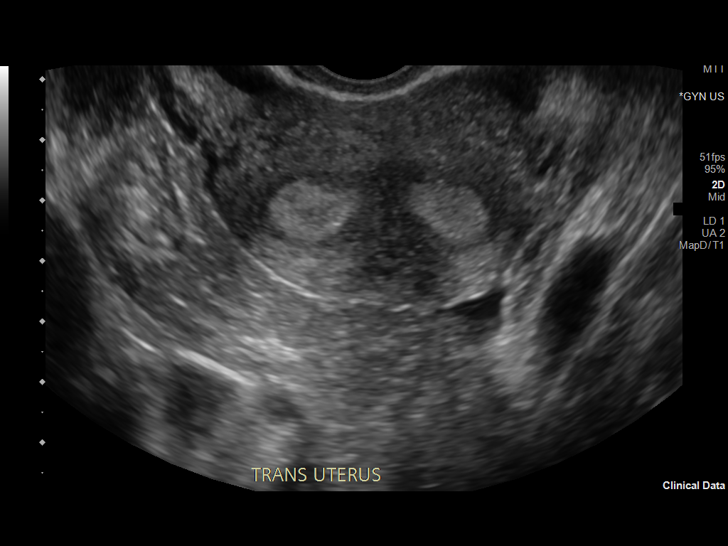
[im 59/101]
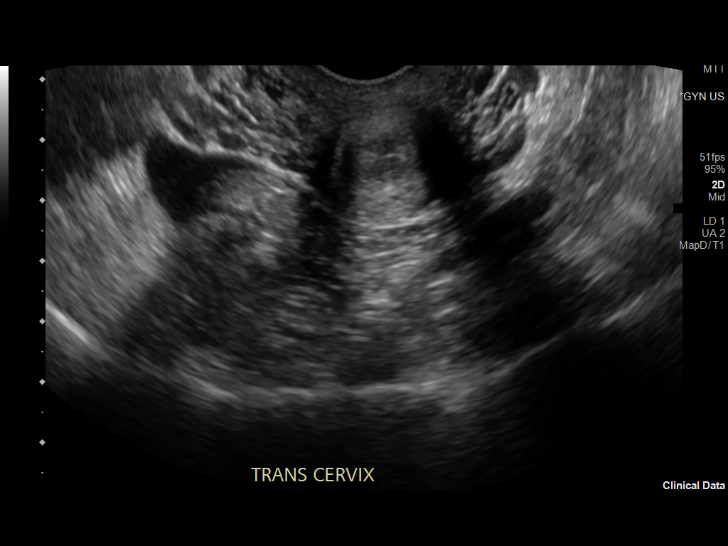
[im 67/101]
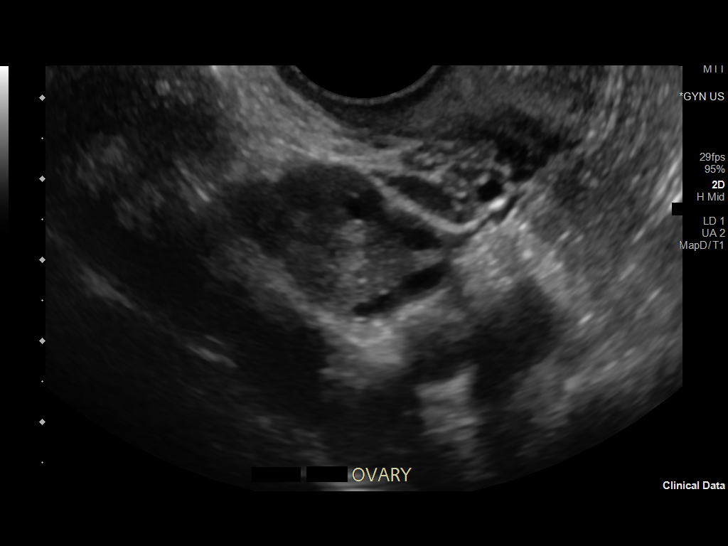
[im 76/101]
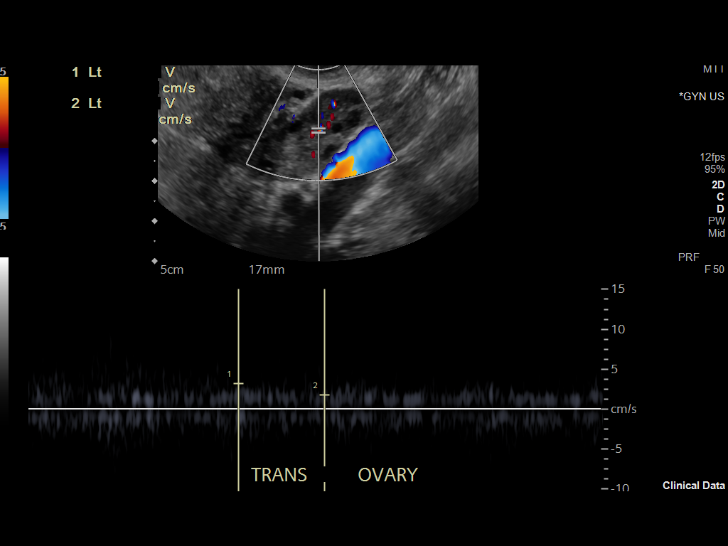
[im 84/101]
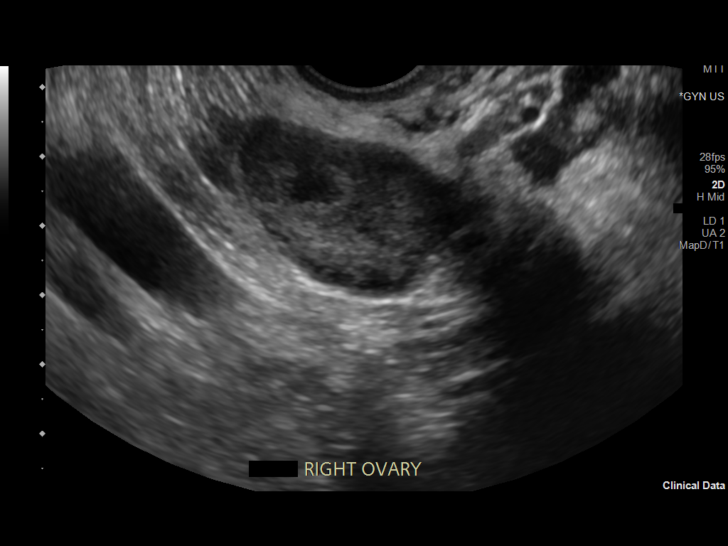
[im 92/101]
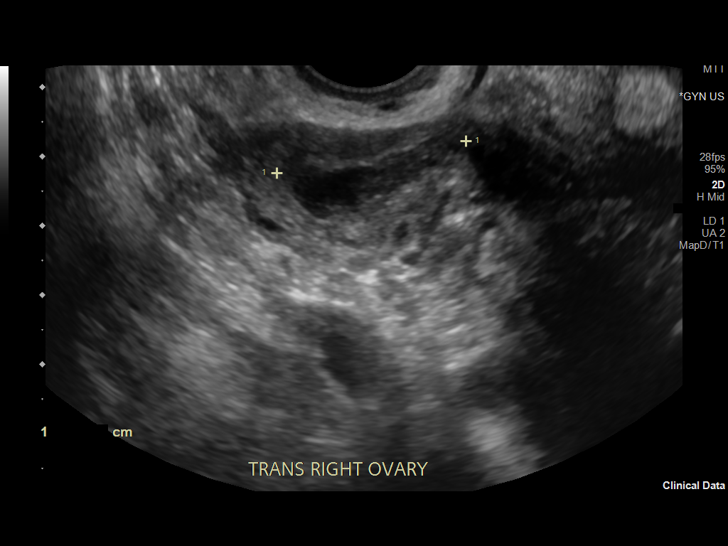
[im 101/101]
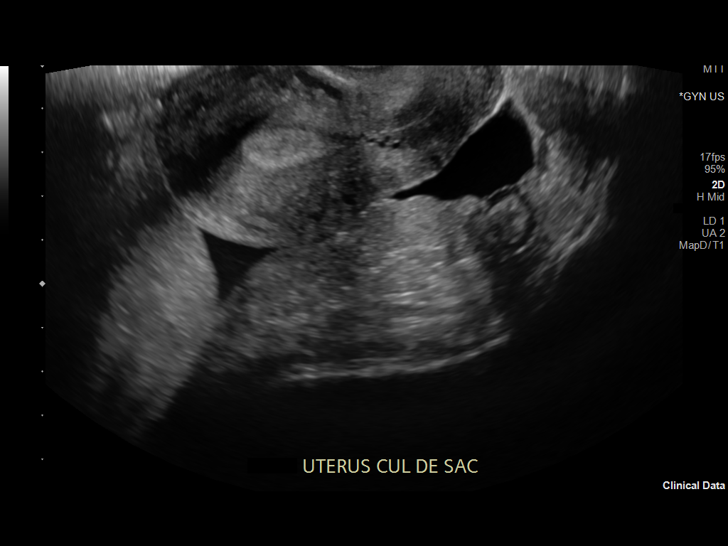

[13 of 25 positions shown; findings below may reference images not displayed]

FINDINGS: Uterus

Measurements: 7.4 x 3.9 x 5.6 cm = volume: 85 mL. Arcuate versus
partial bicornuate anatomy. No fibroids or other mass visualized.

Endometrium

Thickness: 8 mm.  No focal abnormality visualized.

Right ovary

Measurements: 4.2 x 2.3 x 4.0 cm = volume: 21 mL. Normal
appearance/no adnexal mass. Corpus luteum.

Left ovary

Measurements: 2.8 x 1.9 x 2.9 cm = volume: 8 mL. Normal
appearance/no adnexal mass.

Pulsed Doppler evaluation of both ovaries demonstrates normal
low-resistance arterial and venous waveforms.

Other findings

Small free fluid in the pelvis, likely physiologic.
IMPRESSION: 1. No acute abnormality.
2. Arcuate versus partial bicornuate uterine anatomy.

## 2021-01-13 ENCOUNTER — Encounter (HOSPITAL_COMMUNITY): Payer: Self-pay | Admitting: Emergency Medicine

## 2021-01-13 ENCOUNTER — Other Ambulatory Visit: Payer: Self-pay

## 2021-01-13 ENCOUNTER — Emergency Department (HOSPITAL_COMMUNITY)
Admission: EM | Admit: 2021-01-13 | Discharge: 2021-01-14 | Disposition: A | Discharge status: Home / Self Care | Payer: BLUE CROSS/BLUE SHIELD | Attending: Emergency Medicine | Admitting: Emergency Medicine

## 2021-01-13 DIAGNOSIS — R1032 Left lower quadrant pain: Secondary | ICD-10-CM | POA: Insufficient documentation

## 2021-01-13 DIAGNOSIS — Z5321 Procedure and treatment not carried out due to patient leaving prior to being seen by health care provider: Secondary | ICD-10-CM | POA: Insufficient documentation

## 2021-01-13 LAB — I-STAT BETA HCG BLOOD, ED (MC, WL, AP ONLY): I-stat hCG, quantitative: <5 m[IU]/mL (ref <5)

## 2021-01-13 NOTE — ED Triage Notes (Signed)
Pt reports lower left abdominal pain X1 week.  The pain comes and goes and sometimes moves to the back.

## 2021-01-14 LAB — URINALYSIS, ROUTINE W REFLEX MICROSCOPIC
Bilirubin Urine: NEGATIVE
Glucose, UA: NEGATIVE mg/dL
Hgb urine dipstick: NEGATIVE
Ketones, ur: NEGATIVE mg/dL
Nitrite: NEGATIVE
Protein, ur: 30 mg/dL — AB
Specific Gravity, Urine: 1.027 (ref 1.005–1.030)
pH: 5 (ref 5.0–8.0)

## 2021-01-14 LAB — CBC
HCT: 41.8 % (ref 36.0–46.0)
Hemoglobin: 13.3 g/dL (ref 12.0–15.0)
MCH: 27.1 pg (ref 26.0–34.0)
MCHC: 31.8 g/dL (ref 30.0–36.0)
MCV: 85.3 fL (ref 80.0–100.0)
Platelets: 295 10*3/uL (ref 150–400)
RBC: 4.9 MIL/uL (ref 3.87–5.11)
RDW: 14.6 % (ref 11.5–15.5)
WBC: 11.2 10*3/uL — ABNORMAL HIGH (ref 4.0–10.5)
nRBC: 0 % (ref 0.0–0.2)

## 2021-01-14 LAB — COMPREHENSIVE METABOLIC PANEL
ALT: 14 U/L (ref 0–44)
AST: 17 U/L (ref 15–41)
Albumin: 4.1 g/dL (ref 3.5–5.0)
Alkaline Phosphatase: 83 U/L (ref 38–126)
Anion gap: 10 (ref 5–15)
BUN: 10 mg/dL (ref 6–20)
CO2: 24 mmol/L (ref 22–32)
Calcium: 9.3 mg/dL (ref 8.9–10.3)
Chloride: 103 mmol/L (ref 98–111)
Creatinine, Ser: 0.83 mg/dL (ref 0.44–1.00)
GFR, Estimated: >60 mL/min (ref >60)
Glucose, Bld: 87 mg/dL (ref 70–99)
Potassium: 4.1 mmol/L (ref 3.5–5.1)
Sodium: 137 mmol/L (ref 135–145)
Total Bilirubin: 0.7 mg/dL (ref 0.3–1.2)
Total Protein: 7.1 g/dL (ref 6.5–8.1)

## 2021-01-14 LAB — LIPASE, BLOOD: Lipase: 42 U/L (ref 11–51)

## 2021-01-14 NOTE — ED Notes (Signed)
Pt states they are leaving  

## 2024-08-26 ENCOUNTER — Encounter (HOSPITAL_BASED_OUTPATIENT_CLINIC_OR_DEPARTMENT_OTHER): Payer: Self-pay

## 2024-08-26 ENCOUNTER — Other Ambulatory Visit (HOSPITAL_BASED_OUTPATIENT_CLINIC_OR_DEPARTMENT_OTHER): Payer: Self-pay

## 2024-08-26 ENCOUNTER — Ambulatory Visit (HOSPITAL_BASED_OUTPATIENT_CLINIC_OR_DEPARTMENT_OTHER)
Admission: EM | Admit: 2024-08-26 | Discharge: 2024-08-26 | Disposition: A | Discharge status: Home / Self Care | Payer: Self-pay | Attending: Family Medicine | Admitting: Family Medicine

## 2024-08-26 DIAGNOSIS — G43909 Migraine, unspecified, not intractable, without status migrainosus: Secondary | ICD-10-CM

## 2024-08-26 MED ORDER — ONDANSETRON 4 MG PO TBDP
4.0000 mg | ORAL_TABLET | Freq: Three times a day (TID) | ORAL | 0 refills | Status: AC | PRN
  Filled 2024-08-26: qty 20, 7d supply, fill #0

## 2024-08-26 MED ORDER — KETOROLAC TROMETHAMINE 30 MG/ML IJ SOLN
30.0000 mg | Freq: Once | INTRAMUSCULAR | Status: AC
Start: 2024-08-26 — End: 2024-08-26
  Administered 2024-08-26: 30 mg via INTRAMUSCULAR

## 2024-08-26 MED ORDER — ONDANSETRON HCL 4 MG/2ML IJ SOLN
4.0000 mg | Freq: Once | INTRAMUSCULAR | Status: AC
Start: 2024-08-26 — End: 2024-08-26
  Administered 2024-08-26: 4 mg via INTRAMUSCULAR

## 2024-08-26 MED ORDER — RIZATRIPTAN BENZOATE 5 MG PO TABS
5.0000 mg | ORAL_TABLET | ORAL | 0 refills | Status: DC | PRN
  Filled 2024-08-26: qty 10, 30d supply, fill #0

## 2024-08-26 MED ORDER — SUMATRIPTAN SUCCINATE 6 MG/0.5ML ~~LOC~~ SOLN
6.0000 mg | Freq: Once | SUBCUTANEOUS | Status: AC
  Administered 2024-08-26: 6 mg via SUBCUTANEOUS

## 2024-08-26 MED ORDER — KETOROLAC TROMETHAMINE 30 MG/ML IJ SOLN: 30.0000 mg | Freq: Once | INTRAMUSCULAR | Status: DC

## 2024-08-26 NOTE — Discharge Instructions (Addendum)
 We gave you a migraine cocktail here today.  Go home and rest.  Make sure you are staying hydrated. I have prescribed some Maxalt  to use as needed in case another migraine returns. I have also prescribed some Zofran  if you need this for nausea, vomiting. Follow-up with your doctor for any continued issues

## 2024-08-26 NOTE — ED Provider Notes (Signed)
 [facility]    CSN: [id number] Arrival date & time: 08/26/24  1520      History   Chief Complaint Chief Complaint  Patient presents with   Migraine    HPI Song A Wimbish is a 28 y.o. female.   Pt c/o migraine since last night.  Pain is described as sharp and achy and is located in the right temple, right base of skull, and down her neck. She has been having increased nausea and vomiting and her head will start to throb after she vomits. She has been unable to keep anything down today. Pt denies aura. Pt has had a hx of migraines and used to have rx for maxalt , imitrex , and topamax but does not a current rx.    Migraine    Past Medical History:  Diagnosis Date   Anxiety    Asthma    Depression    Eczema    Headache    Obesity     Patient Active Problem List   Diagnosis Date Noted   Chronic intractable headache 12/02/2018    Past Surgical History:  Procedure Laterality Date   WISDOM TOOTH EXTRACTION     believes 3 were removed    OB History     Gravida  0   Para  0   Term  0   Preterm  0   AB  0   Living  0      SAB  0   IAB  0   Ectopic  0   Multiple  0   Live Births  0            Home Medications    Prior to Admission medications   Medication Sig Start Date End Date Taking? Authorizing Provider  ondansetron  (ZOFRAN -ODT) 4 MG disintegrating tablet Take 1 tablet (4 mg total) by mouth every 8 (eight) hours as needed for nausea or vomiting. 08/26/24  Yes Rajah Tagliaferro A, FNP  rizatriptan  (MAXALT ) 5 MG tablet Take 1 tablet (5 mg total) by mouth as needed for migraine. May repeat in 2 hours if needed 08/26/24  Yes Zyren Sevigny, Wilbert LABOR, FNP    Family History Family History  Problem Relation Age of Onset   Migraines Mother     Social History Social History   Tobacco Use   Smoking status: Never   Smokeless tobacco: Never  Vaping Use   Vaping status: Never Used  Substance Use Topics   Alcohol use: Yes    Comment: occasional    Drug use: Never     Allergies   Patient has no known allergies.   Review of Systems Review of Systems See HPI  Physical Exam Triage Vital Signs ED Triage Vitals  Encounter Vitals Group     BP 08/26/24 1538 124/89     Girls Systolic BP Percentile --      Girls Diastolic BP Percentile --      Boys Systolic BP Percentile --      Boys Diastolic BP Percentile --      Pulse Rate 08/26/24 1538 91     Resp 08/26/24 1538 20     Temp 08/26/24 1538 98.5 F (36.9 C)     Temp Source 08/26/24 1538 Oral     SpO2 08/26/24 1538 96 %     Weight --      Height --      Head Circumference --      Peak Flow --  Pain Score 08/26/24 1535 8     Pain Loc --      Pain Education --      Exclude from Growth Chart --    No data found.  Updated Vital Signs BP 124/89 (BP Location: Right Arm)   Pulse 91   Temp 98.5 F (36.9 C) (Oral)   Resp 20   LMP 08/17/2024 (Approximate)   SpO2 96%   Visual Acuity Right Eye Distance:   Left Eye Distance:   Bilateral Distance:    Right Eye Near:   Left Eye Near:    Bilateral Near:     Physical Exam Vitals and nursing note reviewed.  Constitutional:      General: She is not in acute distress.    Appearance: Normal appearance. She is not ill-appearing, toxic-appearing or diaphoretic.  Eyes:     Conjunctiva/sclera: Conjunctivae normal.  Pulmonary:     Effort: Pulmonary effort is normal.  Neurological:     General: No focal deficit present.     Mental Status: She is alert.  Psychiatric:        Mood and Affect: Mood normal.      UC Treatments / Results  Labs (all labs ordered are listed, but only abnormal results are displayed) Labs Reviewed - No data to display  EKG   Radiology No results found.  Procedures Procedures (including critical care time)  Medications Ordered in UC Medications  ondansetron  (ZOFRAN ) injection 4 mg (4 mg Intramuscular Given 08/26/24 1606)  SUMAtriptan  (IMITREX ) injection 6 mg (6 mg Subcutaneous Given  08/26/24 1606)  ketorolac  (TORADOL ) 30 MG/ML injection 30 mg (30 mg Intramuscular Given 08/26/24 1616)    Initial Impression / Assessment and Plan / UC Course  I have reviewed the triage vital signs and the nursing notes.  Pertinent labs & imaging results that were available during my care of the patient were reviewed by me and considered in my medical decision making (see chart for details).     Migraine-no specific concerns or red flags here today.  Migraine cocktail given here in clinic.  Recommend go home and rest.  I have prescribed rizatriptan  and Zofran  to use as needed if this returns.  Recommend if any other concerning issues or continuous problems she will need to follow-up with her primary care. Final Clinical Impressions(s) / UC Diagnoses   Final diagnoses:  Migraine without status migrainosus, not intractable, unspecified migraine type     Discharge Instructions      We gave you a migraine cocktail here today.  Go home and rest.  Make sure you are staying hydrated. I have prescribed some Maxalt  to use as needed in case another migraine returns. I have also prescribed some Zofran  if you need this for nausea, vomiting. Follow-up with your doctor for any continued issues    ED Prescriptions     Medication Sig Dispense Auth. Provider   rizatriptan  (MAXALT ) 5 MG tablet Take 1 tablet (5 mg total) by mouth as needed for migraine. May repeat in 2 hours if needed 10 tablet Nikya Busler A, FNP   ondansetron  (ZOFRAN -ODT) 4 MG disintegrating tablet Take 1 tablet (4 mg total) by mouth every 8 (eight) hours as needed for nausea or vomiting. 20 tablet Adah Wilbert LABOR, FNP      PDMP not reviewed this encounter.   Adah Wilbert LABOR, FNP 08/26/24 1625

## 2024-08-26 NOTE — ED Triage Notes (Addendum)
 Pt c/o migraine since last night. Pain is located has been moving around some. Pain is described as sharp and achy and is located in the right temple, right bas of skull, and down her neck. She has been having increased nausea and vomiting and her head will start to throb after she vomits. She has been unable to keep anything down today. Pt denies aura. Pt has had a hx of migraines and used to have rx for maxalt , imitrex , and topamax but does not a current rx.

## 2024-08-27 ENCOUNTER — Other Ambulatory Visit (HOSPITAL_BASED_OUTPATIENT_CLINIC_OR_DEPARTMENT_OTHER): Payer: Self-pay

## 2024-09-09 ENCOUNTER — Other Ambulatory Visit (HOSPITAL_BASED_OUTPATIENT_CLINIC_OR_DEPARTMENT_OTHER): Payer: Self-pay

## 2024-10-13 ENCOUNTER — Ambulatory Visit (HOSPITAL_BASED_OUTPATIENT_CLINIC_OR_DEPARTMENT_OTHER)
Admission: EM | Admit: 2024-10-13 | Discharge: 2024-10-13 | Disposition: A | Discharge status: Home / Self Care | Payer: Self-pay

## 2024-10-13 ENCOUNTER — Other Ambulatory Visit (HOSPITAL_BASED_OUTPATIENT_CLINIC_OR_DEPARTMENT_OTHER): Payer: Self-pay

## 2024-10-13 ENCOUNTER — Encounter (HOSPITAL_BASED_OUTPATIENT_CLINIC_OR_DEPARTMENT_OTHER): Payer: Self-pay

## 2024-10-13 DIAGNOSIS — G43019 Migraine without aura, intractable, without status migrainosus: Secondary | ICD-10-CM

## 2024-10-13 MED ORDER — KETOROLAC TROMETHAMINE 30 MG/ML IJ SOLN
30.0000 mg | Freq: Once | INTRAMUSCULAR | Status: AC
  Administered 2024-10-13: 30 mg via INTRAVENOUS

## 2024-10-13 MED ORDER — DEXAMETHASONE SOD PHOSPHATE PF 10 MG/ML IJ SOLN
10.0000 mg | Freq: Once | INTRAMUSCULAR | Status: AC
  Administered 2024-10-13: 10 mg via INTRAVENOUS

## 2024-10-13 MED ORDER — ONDANSETRON HCL 4 MG/2ML IJ SOLN
4.0000 mg | Freq: Once | INTRAMUSCULAR | Status: AC
  Administered 2024-10-13: 4 mg via INTRAVENOUS

## 2024-10-13 MED ORDER — BUTALBITAL-APAP-CAFFEINE 50-325-40 MG PO TABS
1.0000 | ORAL_TABLET | Freq: Four times a day (QID) | ORAL | 0 refills | Status: AC | PRN
  Filled 2024-10-13: qty 12, 3d supply, fill #0

## 2024-10-13 MED ORDER — SODIUM CHLORIDE 0.9 % IV BOLUS
1000.0000 mL | Freq: Once | INTRAVENOUS | Status: AC
  Administered 2024-10-13: 1000 mL via INTRAVENOUS

## 2024-10-13 NOTE — ED Triage Notes (Signed)
 Pt states she has had a migraine since she woke up this morning. She has been feeling nauseous and has had multiple episodes of vomiting since migraine onset. Pt has not taken any pain meds for the pain. She did attempt to take a Zofran  but was unable to keep it down. Pain is described as sharp at her right temple that she can feel down her neck.

## 2024-10-13 NOTE — Discharge Instructions (Addendum)
 You were seen today for a migraine with nausea and vomiting. Your exam and vital signs were reassuring, and there were no signs of a more serious cause. You received IV fluids and medications to help reduce the headache and nausea, and you improved after treatment. You have been prescribed Fioricet to use as needed for headaches. Take it only as directed and avoid taking it more than a couple of days a week, as using it too often can lead to rebound headaches. Try to rest in a quiet, dark room, drink plenty of fluids, avoid skipping meals, and pay attention to potential triggers such as stress, certain foods, or lack of sleep. Follow up with your primary care provider to discuss ongoing migraine management and whether a preventive medication may be helpful; they may also refer you to a neurologist if needed. Go to the emergency room right away if you develop a sudden severe headache that feels like the worst headache of your life, new or worsening vision changes, confusion, trouble speaking, weakness or numbness in any part of your body, fever, neck stiffness, persistent vomiting, or if you begin to feel significantly worse.

## 2024-10-13 NOTE — ED Provider Notes (Signed)
 [facility]    CSN: [id number] Arrival date & time: 10/13/24  1045      History   Chief Complaint Chief Complaint  Patient presents with   Migraine    HPI Wendy Russo is a 28 y.o. female.   Discussed the use of AI scribe software for clinical note transcription with the patient, who gave verbal consent to proceed.   The patient presents with a migraine headache accompanied by nausea and vomiting that began upon waking this morning. She has a known history of migraines, but reports that over the past several weeks they have become more frequent and consistently severe, which is a change from her usual pattern. She previously had good control with migraine prophylactic medication but is not currently on any preventive or maintenance therapy.  This morning, she attempted to take Zofran  for nausea but vomited shortly afterward and has been unable to tolerate oral medications. She describes the headache as severe but not the worst headache of her life. Associated symptoms include photophobia, mild dizziness with movement, and a sense of brain fog or slowed thinking. She also reports posterior neck discomfort but denies neck stiffness. She has not taken any medications today for headache relief. She initially planned to go to work but elected to seek evaluation due to worsening symptoms.  The following sections of the patient's history were reviewed and updated as appropriate: allergies, current medications, past family history, past medical history, past social history, past surgical history, and problem list.     Past Medical History:  Diagnosis Date   Anxiety    Asthma    Depression    Eczema    Headache    Obesity     Patient Active Problem List   Diagnosis Date Noted   Chronic intractable headache 12/02/2018    Past Surgical History:  Procedure Laterality Date   WISDOM TOOTH EXTRACTION     believes 3 were removed    OB History     Gravida  0   Para   0   Term  0   Preterm  0   AB  0   Living  0      SAB  0   IAB  0   Ectopic  0   Multiple  0   Live Births  0            Home Medications    Prior to Admission medications   Medication Sig Start Date End Date Taking? Authorizing Provider  butalbital-acetaminophen-caffeine (FIORICET) 50-325-40 MG tablet Take 1 tablet by mouth every 6 (six) hours as needed for migraine. Do not to exceed 6 tablets per 24 hour period 10/13/24  Yes Anasia Agro, Lucie, FNP  escitalopram (LEXAPRO) 10 MG tablet Take 10 mg by mouth daily. 09/17/24  Yes [provider]  ondansetron  (ZOFRAN -ODT) 4 MG disintegrating tablet Take 1 tablet (4 mg total) by mouth every 8 (eight) hours as needed for nausea or vomiting. 08/26/24   Adah Wilbert LABOR, FNP    Family History Family History  Problem Relation Age of Onset   Migraines Mother     Social History Social History   Tobacco Use   Smoking status: Never   Smokeless tobacco: Never  Vaping Use   Vaping status: Never Used  Substance Use Topics   Alcohol use: Yes    Comment: occasional   Drug use: Never     Allergies   Sumatriptan    Review of Systems Review of Systems  Constitutional:  Negative for fever.  Eyes:  Positive for photophobia. Negative for visual disturbance.  Gastrointestinal:  Positive for nausea and vomiting.  Musculoskeletal:  Positive for neck pain. Negative for neck stiffness.  Neurological:  Positive for dizziness (a little when moving) and headaches. Negative for syncope, speech difficulty and numbness.  Psychiatric/Behavioral:  Positive for confusion (brain fog).   All other systems reviewed and are negative.    Physical Exam Triage Vital Signs ED Triage Vitals  Encounter Vitals Group     BP 10/13/24 1152 (!) 140/87     Girls Systolic BP Percentile --      Girls Diastolic BP Percentile --      Boys Systolic BP Percentile --      Boys Diastolic BP Percentile --      Pulse Rate 10/13/24 1152 80      Resp 10/13/24 1152 20     Temp 10/13/24 1152 98.3 F (36.8 C)     Temp Source 10/13/24 1152 Oral     SpO2 10/13/24 1152 96 %     Weight --      Height --      Head Circumference --      Peak Flow --      Pain Score 10/13/24 1151 8     Pain Loc --      Pain Education --      Exclude from Growth Chart --    No data found.  Updated Vital Signs BP (!) 140/87 (BP Location: Right Arm)   Pulse 80   Temp 98.3 F (36.8 C) (Oral)   Resp 20   LMP 10/04/2024 (Approximate)   SpO2 96%   Visual Acuity Right Eye Distance:   Left Eye Distance:   Bilateral Distance:    Right Eye Near:   Left Eye Near:    Bilateral Near:     Physical Exam Vitals reviewed.  Constitutional:      General: She is awake. She is not in acute distress.    Appearance: Normal appearance. She is well-developed. She is obese. She is not ill-appearing, toxic-appearing or diaphoretic.  HENT:     Head: Normocephalic.     Right Ear: Hearing normal.     Left Ear: Hearing normal.     Nose: Nose normal.     Mouth/Throat:     Mouth: Mucous membranes are moist.  Eyes:     General: Vision grossly intact. No visual field deficit.    Extraocular Movements: Extraocular movements intact.     Right eye: Normal extraocular motion and no nystagmus.     Left eye: Normal extraocular motion and no nystagmus.     Conjunctiva/sclera: Conjunctivae normal.     Pupils: Pupils are equal, round, and reactive to light.     Visual Fields: Right eye visual fields normal and left eye visual fields normal.  Neck:     Trachea: Trachea normal.  Cardiovascular:     Rate and Rhythm: Normal rate.     Heart sounds: Normal heart sounds.  Pulmonary:     Effort: Pulmonary effort is normal.     Breath sounds: Normal breath sounds and air entry.  Abdominal:     Palpations: Abdomen is soft.  Musculoskeletal:        General: Normal range of motion.     Cervical back: Full passive range of motion without pain, normal range of motion and neck  supple. No rigidity or tenderness.  Lymphadenopathy:     Cervical: No  cervical adenopathy.  Skin:    General: Skin is warm and dry.  Neurological:     General: No focal deficit present.     Mental Status: She is alert and oriented to person, place, and time.     Cranial Nerves: No cranial nerve deficit.     Sensory: Sensation is intact. No sensory deficit.     Motor: Motor function is intact. No weakness.     Coordination: Coordination is intact.     Gait: Gait is intact.  Psychiatric:        Attention and Perception: Attention and perception normal.        Mood and Affect: Mood and affect normal.        Speech: Speech normal.        Behavior: Behavior normal. Behavior is cooperative.        Thought Content: Thought content normal.        Cognition and Memory: Cognition normal.        Judgment: Judgment normal.      UC Treatments / Results  Labs (all labs ordered are listed, but only abnormal results are displayed) Labs Reviewed - No data to display  EKG   Radiology No results found.  Procedures Procedures (including critical care time)  Medications Ordered in UC Medications  dexamethasone (DECADRON) injection 10 mg (10 mg Intravenous Given 10/13/24 1338)  ketorolac  (TORADOL ) 30 MG/ML injection 30 mg (30 mg Intravenous Given 10/13/24 1332)  ondansetron  (ZOFRAN ) injection 4 mg (4 mg Intravenous Given 10/13/24 1326)  sodium chloride 0.9 % bolus 1,000 mL (1,000 mLs Intravenous New Bag/Given 10/13/24 1324)    Initial Impression / Assessment and Plan / UC Course  I have reviewed the triage vital signs and the nursing notes.  Pertinent labs & imaging results that were available during my care of the patient were reviewed by me and considered in my medical decision making (see chart for details).     The patient presents with a known history of migraines and reports a recent increase in frequency and severity. Symptoms today are consistent with her typical migraine  pattern, including photophobia, nausea, vomiting, and cognitive fog, but without features concerning for intracranial hemorrhage or meningitis. She is alert, oriented, afebrile, and nontoxic in appearance. Neurological exam is normal with no focal deficits. Posterior neck discomfort is present but without stiffness. Given inability to tolerate oral medication at home, IV fluids, dexamethasone, ketorolac , and ondansetron  were administered in clinic with symptomatic improvement. She was prescribed Fioricet for breakthrough headaches as needed and counseled on appropriate use to avoid medication-overuse headaches. Follow-up with her primary care provider and consideration of neurology referral for migraine prophylaxis were recommended. She was advised to seek emergency care if she develops sudden severe "worst headache of life," vision changes, persistent vomiting, weakness, numbness, speech difficulty, confusion, fever, or any worsening symptoms.  The following sections of the patient's history were reviewed and updated as appropriate: allergies, current medications, past family history, past medical history, past social history, past surgical history, and problem list.  Final Clinical Impressions(s) / UC Diagnoses   Final diagnoses:  Intractable migraine without aura and without status migrainosus     Discharge Instructions      You were seen today for a migraine with nausea and vomiting. Your exam and vital signs were reassuring, and there were no signs of a more serious cause. You received IV fluids and medications to help reduce the headache and nausea, and you improved after treatment. You  have been prescribed Fioricet to use as needed for headaches. Take it only as directed and avoid taking it more than a couple of days a week, as using it too often can lead to rebound headaches. Try to rest in a quiet, dark room, drink plenty of fluids, avoid skipping meals, and pay attention to potential triggers  such as stress, certain foods, or lack of sleep. Follow up with your primary care provider to discuss ongoing migraine management and whether a preventive medication may be helpful; they may also refer you to a neurologist if needed. Go to the emergency room right away if you develop a sudden severe headache that feels like the worst headache of your life, new or worsening vision changes, confusion, trouble speaking, weakness or numbness in any part of your body, fever, neck stiffness, persistent vomiting, or if you begin to feel significantly worse.     ED Prescriptions     Medication Sig Dispense Auth. Provider   butalbital-acetaminophen-caffeine (FIORICET) 50-325-40 MG tablet Take 1 tablet by mouth every 6 (six) hours as needed for migraine. Do not to exceed 6 tablets per 24 hour period 12 tablet Iola Lukes, FNP      PDMP not reviewed this encounter.   Iola Lukes, [place] 10/13/24 1446
